# Patient Record
Sex: Female | Born: 1975 | Race: White | Hispanic: No | Marital: Married | Smoking: Former smoker
Health system: Southern US, Community
[De-identification: ages and names within clinical notes are randomized; demographics above are authoritative.]

## PROBLEM LIST (undated history)

## (undated) ENCOUNTER — Inpatient Hospital Stay (HOSPITAL_COMMUNITY): Payer: Self-pay

## (undated) DIAGNOSIS — B019 Varicella without complication: Secondary | ICD-10-CM

## (undated) DIAGNOSIS — F329 Major depressive disorder, single episode, unspecified: Secondary | ICD-10-CM

## (undated) DIAGNOSIS — R011 Cardiac murmur, unspecified: Secondary | ICD-10-CM

## (undated) DIAGNOSIS — F419 Anxiety disorder, unspecified: Secondary | ICD-10-CM

## (undated) DIAGNOSIS — R000 Tachycardia, unspecified: Secondary | ICD-10-CM

## (undated) DIAGNOSIS — O99619 Diseases of the digestive system complicating pregnancy, unspecified trimester: Secondary | ICD-10-CM

## (undated) DIAGNOSIS — R87619 Unspecified abnormal cytological findings in specimens from cervix uteri: Secondary | ICD-10-CM

## (undated) DIAGNOSIS — I10 Essential (primary) hypertension: Secondary | ICD-10-CM

## (undated) DIAGNOSIS — IMO0002 Reserved for concepts with insufficient information to code with codable children: Secondary | ICD-10-CM

## (undated) DIAGNOSIS — F32A Depression, unspecified: Secondary | ICD-10-CM

## (undated) DIAGNOSIS — E876 Hypokalemia: Secondary | ICD-10-CM

## (undated) DIAGNOSIS — J45909 Unspecified asthma, uncomplicated: Secondary | ICD-10-CM

## (undated) DIAGNOSIS — U071 COVID-19: Secondary | ICD-10-CM

## (undated) DIAGNOSIS — T7840XA Allergy, unspecified, initial encounter: Secondary | ICD-10-CM

## (undated) DIAGNOSIS — O1490 Unspecified pre-eclampsia, unspecified trimester: Secondary | ICD-10-CM

## (undated) DIAGNOSIS — D649 Anemia, unspecified: Secondary | ICD-10-CM

## (undated) DIAGNOSIS — K219 Gastro-esophageal reflux disease without esophagitis: Secondary | ICD-10-CM

## (undated) DIAGNOSIS — A749 Chlamydial infection, unspecified: Secondary | ICD-10-CM

## (undated) HISTORY — DX: Unspecified asthma, uncomplicated: J45.909

## (undated) HISTORY — PX: WISDOM TOOTH EXTRACTION: SHX21

## (undated) HISTORY — DX: Varicella without complication: B01.9

## (undated) HISTORY — DX: Unspecified pre-eclampsia, unspecified trimester: O14.90

## (undated) HISTORY — DX: Anemia, unspecified: D64.9

## (undated) HISTORY — DX: Tachycardia, unspecified: R00.0

## (undated) HISTORY — DX: COVID-19: U07.1

## (undated) HISTORY — DX: Cardiac murmur, unspecified: R01.1

## (undated) HISTORY — DX: Allergy, unspecified, initial encounter: T78.40XA

## (undated) HISTORY — DX: Hypokalemia: E87.6

---

## 1998-06-12 ENCOUNTER — Emergency Department (HOSPITAL_COMMUNITY): Admission: EM | Admit: 1998-06-12 | Discharge: 1998-06-12 | Payer: Self-pay | Admitting: Emergency Medicine

## 1998-12-16 ENCOUNTER — Other Ambulatory Visit: Admission: RE | Admit: 1998-12-16 | Discharge: 1998-12-16 | Payer: Self-pay | Admitting: *Deleted

## 1999-12-18 ENCOUNTER — Other Ambulatory Visit: Admission: RE | Admit: 1999-12-18 | Discharge: 1999-12-18 | Payer: Self-pay | Admitting: *Deleted

## 2001-03-27 ENCOUNTER — Other Ambulatory Visit: Admission: RE | Admit: 2001-03-27 | Discharge: 2001-03-27 | Payer: Self-pay | Admitting: *Deleted

## 2001-04-11 ENCOUNTER — Encounter: Payer: Self-pay | Admitting: Emergency Medicine

## 2001-04-11 ENCOUNTER — Emergency Department (HOSPITAL_COMMUNITY): Admission: EM | Admit: 2001-04-11 | Discharge: 2001-04-11 | Payer: Self-pay | Admitting: Emergency Medicine

## 2002-03-31 ENCOUNTER — Other Ambulatory Visit: Admission: RE | Admit: 2002-03-31 | Discharge: 2002-03-31 | Payer: Self-pay | Admitting: *Deleted

## 2003-05-20 ENCOUNTER — Other Ambulatory Visit: Admission: RE | Admit: 2003-05-20 | Discharge: 2003-05-20 | Payer: Self-pay | Admitting: *Deleted

## 2003-10-25 ENCOUNTER — Ambulatory Visit (HOSPITAL_COMMUNITY): Admission: RE | Admit: 2003-10-25 | Discharge: 2003-10-25 | Payer: Self-pay | Admitting: Obstetrics and Gynecology

## 2003-11-25 ENCOUNTER — Inpatient Hospital Stay (HOSPITAL_COMMUNITY): Admission: AD | Admit: 2003-11-25 | Discharge: 2003-11-25 | Payer: Self-pay | Admitting: Obstetrics and Gynecology

## 2003-12-15 ENCOUNTER — Inpatient Hospital Stay (HOSPITAL_COMMUNITY): Admission: AD | Admit: 2003-12-15 | Discharge: 2003-12-15 | Payer: Self-pay | Admitting: Obstetrics and Gynecology

## 2003-12-21 ENCOUNTER — Inpatient Hospital Stay (HOSPITAL_COMMUNITY): Admission: AD | Admit: 2003-12-21 | Discharge: 2003-12-26 | Payer: Self-pay | Admitting: Obstetrics and Gynecology

## 2003-12-23 ENCOUNTER — Encounter (INDEPENDENT_AMBULATORY_CARE_PROVIDER_SITE_OTHER): Payer: Self-pay | Admitting: Specialist

## 2003-12-27 ENCOUNTER — Encounter: Admission: RE | Admit: 2003-12-27 | Discharge: 2003-12-27 | Payer: Self-pay | Admitting: Obstetrics and Gynecology

## 2004-02-09 ENCOUNTER — Other Ambulatory Visit: Admission: RE | Admit: 2004-02-09 | Discharge: 2004-02-09 | Payer: Self-pay | Admitting: Obstetrics and Gynecology

## 2005-04-12 ENCOUNTER — Other Ambulatory Visit: Admission: RE | Admit: 2005-04-12 | Discharge: 2005-04-12 | Payer: Self-pay | Admitting: Obstetrics and Gynecology

## 2005-09-27 ENCOUNTER — Ambulatory Visit: Payer: Self-pay | Admitting: Family Medicine

## 2006-04-29 ENCOUNTER — Emergency Department (HOSPITAL_COMMUNITY): Admission: EM | Admit: 2006-04-29 | Discharge: 2006-04-29 | Payer: Self-pay | Admitting: Family Medicine

## 2006-12-20 ENCOUNTER — Emergency Department (HOSPITAL_COMMUNITY): Admission: EM | Admit: 2006-12-20 | Discharge: 2006-12-20 | Payer: Self-pay | Admitting: Family Medicine

## 2007-12-21 ENCOUNTER — Emergency Department (HOSPITAL_COMMUNITY): Admission: EM | Admit: 2007-12-21 | Discharge: 2007-12-21 | Payer: Self-pay | Admitting: Emergency Medicine

## 2008-04-23 ENCOUNTER — Ambulatory Visit (HOSPITAL_COMMUNITY): Admission: RE | Admit: 2008-04-23 | Discharge: 2008-04-23 | Payer: Self-pay | Admitting: Ophthalmology

## 2008-05-18 ENCOUNTER — Encounter: Admission: RE | Admit: 2008-05-18 | Discharge: 2008-08-16 | Payer: Self-pay | Admitting: Internal Medicine

## 2009-02-07 ENCOUNTER — Emergency Department (HOSPITAL_COMMUNITY): Admission: EM | Admit: 2009-02-07 | Discharge: 2009-02-07 | Payer: Self-pay | Admitting: Emergency Medicine

## 2010-06-02 NOTE — Op Note (Signed)
Kimberly [name], Kimberly [name]              ACCOUNT NO.:  [id number]   MEDICAL RECORD NO.:  [id number]          PATIENT TYPE:  INP   LOCATION:  9171                          FACILITY:  [facility]   PHYSICIAN:  Dineen Kid. Rana Snare, M.D.    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   PROCEDURE:  Primary low transverse cesarean section.   SURGEON:  Dineen Kid. Rana Snare, M.D.   ANESTHESIA:  Spinal.   INDICATIONS FOR PROCEDURE:  The patient is a 35 year old gravida 1, admitted  to the hospital for preeclampsia.  She had an amniocentesis yesterday which  returned as mature.  She was undergoing induction of labor, had spontaneous  rupture of membranes, had port wine amniotic fluid, and mild to moderate  bleeding in the fluid.  Cervix was 2 cm, 50%, -3 station.  Fetal heart rate  was still reactive, but because of probable abruptio placenta remote from  delivery, planned primary low transverse cesarean section.  Risks and  benefits were discussed, and informed consent was obtained.   FINDINGS:  1.  A 25% abruptio placenta in the lower edge of the placenta.  2.  Viable female infant, Apgars 9 and 9, pH arterial 7.34, and weight 6      pounds 5 ounces.   DESCRIPTION OF PROCEDURE:  After adequate analgesia, the patient was placed  in the supine position with a left lateral tilt.  She was sterilely prepped  and draped.  The bladder was sterilely drained with Foley catheter.  Pfannenstiel skin incision was made two fingerbreadths above the pubic  symphysis, taken down sharply to the fascia which was incised transversely  and extended superiorly and inferiorly off the bellies of the rectus muscle,  separated sharply in the midline, the peritoneum was entered sharply.  The  bladder flap was created and replaced behind the bladder blade.  Low segment  myotomy incision was made down to the infant's vertex, extended laterally  with the operator's fingertips.  The lower edge of the placenta was noted to  be separated at the presentation with the fetal vertex.  The infant's vertex  was delivered.  The nares and pharynx suctioned.  The remainder of the  infant was then delivered.  Cord clamped and cut and handed to the  pediatricians for resuscitation.  Cord blood was then obtained.  Placenta  extracted manually.  The uterus was exteriorized, wiped clean with a dry  lap. The myotomy incision was closed in two layers, first being a running  locking layer, the second being an imbricating layer of 0 Monocryl suture.  The uterus was placed back into the peritoneal cavity and after copious  amount of irrigation, adequate hemostasis was assured, the peritoneum was  closed with 0 Monocryl.  Rectus muscle plicated in the midline.  The fascia  was then  closed with a #1 PDS in a running fashion.  Irrigation was applied  and after adequate hemostasis, skin staples and Steri-Strips applied.  The  patient tolerated the procedure well and was stable on transfer to the  recovery room.  The needle, sponge, and instrument counts correct x3.  Estimated blood loss  was 800 mL.  The patient received 1 gram of Rocephin after delivery of the  placenta.  The patient will be transferred to the intensive care unit for  continuation of magnesium sulfate for 24 hours due to preeclampsia.      DCL/MEDQ  D:  12/23/2003  T:  12/23/2003  Job:  [id number]

## 2010-06-02 NOTE — Assessment & Plan Note (Signed)
[facility]                             [facility] OFFICE NOTE   NAME:Kimberly [name], Kimberly [name]                       MRN:          [id number]  DATE:09/28/2005                            DOB:          [birth year]    NEW PATIENT HISTORY AND PHYSICAL   CHIEF COMPLAINT:  This 35 year old white female here to establish new  doctor.   HISTORY OF PRESENT ILLNESS:  Ms. Santucci states that she currently works at  [employer].  She was referred to see a doctor because of the  fact that she had lab testing done in a [facility].  She was led to  discuss the following:  1. Hypercholesterolemia:  When she had labs done with the [facility]ness Program      in May of 2007, it showed evidence of an LDL of 147, HDL 54, and      triglycerides 271.  She is open to learning about diet, exercise, as      well as medication.  She already feels that she eats a fairly healthy      diet but recognizes that they do eat out about three times a week      because they are very busy.  2. Anxiety, chronic:  She has been on Zoloft and a generic Sertraline for      about three years.  She was given this prescription by her      gynecologist, Dr. Rana Snare.  The Zoloft initially was helping significantly      with her anxiety, but then when she had to switch over to generic      Sertraline she noted that it felt as though it was not helping as much      as the Zoloft.  She states that she does not have any sadness,      anhedonia, or insomnia, excessive guilt or slowed thinking.  She does      state she has some decreased motivation, intermittent headaches, and is      generally anxious throughout the day.   REVIEW OF SYSTEMS:  Occasional headache, no dizziness, no syncope, no  shortness of breath, no chest pain, no nausea, vomiting, diarrhea,  constipation, some symptoms of allergic rhinitis with runny nose,  congestion.   PAST MEDICAL HISTORY:  1. Anxiety.  2.  Hypercholesterolemia.  3. Preeclampsia history.   HOSPITALIZATIONS/SURGERIES/PROCEDURES:  1. 2005, C-section for placental abruption.  2. Pap smear in March 2007, negative.  3. Tetanus in 2006.   ALLERGIES:  None.   MEDICATIONS:  Trivora birth control pills.   FAMILY HISTORY:  Father deceased at age 26 from suicide.  He has a history  of alcohol and depression.  Mother alive at age 51, who is healthy.  There  is no family history of a heart attack before age 59.  She has two brothers,  who are healthy.  She has a paternal grandmother, who had breast cancer.  She does not think there is any family history of bipolar disorder.  She did  have an aunt with a brain tumor  and who had a stroke.  There is no family  history of diabetes.   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  She has  been married for eight years, and denies any domestic abuse.  She has a  child, who is 77-1/2 years old.  She does not get any regular exercise but  recognizes that she needs to.  She has a varied diet but eats out three  times per week.  She does drink a lot of soda, and a limited amount of  water.  She does not smoke, no alcohol use, and no illegal drug use.   PHYSICAL EXAMINATION:  VITAL SIGNS:  Height 64 inches, weight 222, making  BMI approximately 38.  Blood pressure 114/80, pulse 80, temperature 98.3.  GENERAL:  An overweight-appearing female in no apparent distress.  HEENT:  PERRLA, extraocular muscles intact, oropharynx clear, tympanic  membranes clear, some pale turbinates bilaterally.  NECK:  No thyromegaly, no lymphadenopathy, cervical or supraclavicular.  PULMONARY:  Clear to auscultation bilaterally.  No wheezes, rales, or  rhonchi.  CARDIOVASCULAR:  Regular rate and rhythm, no murmurs, rubs, or gallops, 2+  peripheral pulses, no peripheral edema.  MUSCULOSKELETAL:  Strength 5 out of 5 in upper and lower extremities.  ABDOMEN:  Soft, nontender, normoactive bowel  sounds, no hepatosplenomegaly.  NEUROLOGIC:  Alert and oriented x3, cranial nerves II-XII grossly intact.  PSYCH:  Appropriate affect, good insight.   ASSESSMENT AND PLAN:  1. Hypertriglyceridemia:  After reviewing her cholesterol panel and her      risk factors, it appears that her goal LDL cholesterol is less than      160.  She is at her goal for both HDL and LDL.  She is above goal for      her triglycerides at 271.  We discussed healthy eating habits and how      to decrease triglycerides.  She was given lots of different information      and dietary techniques to lose weight and have a heart-healthy diet.      In addition to that, she would like to start a medication to decrease      her triglycerides.  We will begin Fenofibrate at 160 mg daily.  We will      recheck her cholesterol in approximately three months.  2. Anxiety, chronic:  We will begin another generic medication to see if      this improves her anxiety.  She will start Fluoxetine 40 mg daily.  She      will follow up on her mood in approximately three months.  3. Allergic rhinitis:  She will use over-the-counter Claritin.  She was      also given a Rhinocort sample today to try as a nasal spray to decrease      her symptoms.  She will let me know if she develops more sinus pressure      and fever suggestive of acute sinusitis.  4. Prevention:  She did not have a basic metabolic panel or diabetes      screen done at the      same time as her cholesterol was checked.  This is something we can      discuss in the future.      It appears she is up to date with Pap smears and her tetanus.  She has      no indication for early colon cancer screening or breast cancer  screening.                                    Kerby Nora, MD   AB/MedQ  DD:  09/28/2005  DT:  09/29/2005  Job #:  [id number]

## 2010-06-02 NOTE — H&P (Signed)
Kimberly [name], STINGLEY              ACCOUNT NO.:  [id number]   MEDICAL RECORD NO.:  [id number]          PATIENT TYPE:  INP   LOCATION:  9171                          FACILITY:  [facility]   PHYSICIAN:  Guy Sandifer. Tomblin II, M.D.DATE OF BIRTH:  1975-12-21   DATE OF ADMISSION:  12/21/2003  DATE OF DISCHARGE:                                HISTORY & PHYSICAL   CHIEF COMPLAINT:  Preeclampsia.   HISTORY OF PRESENT ILLNESS:  This patient is a 35 year old married white  female G2 P0 with an EDC of January 19, 2004 consistent with a 10 and one-  half week ultrasound placing her at 47 and five-sevenths weeks whose  prenatal care has been complicated by chronic hypertension.  She has been on  labetalol 100 mg b.i.d.  She was evaluated in the office on December 20, 2003  and was noted to have a blood pressure of 176/90.  Protein was trace.  Protein had been 1+ the week before.  Her labetalol was increased to 200 mg  b.i.d.  PIH labs obtained on December 20, 2003 were within normal limits.  The patient was taken out of work, modified bedrest was discussed with the  patient, and her NST was reactive that day.  She returns today complaining  of a headache awakening her early this morning that has continued through  the day.  No vision changes, no epigastric pain.  She is being admitted for  evaluation and treatment of preeclampsia.   PAST MEDICAL HISTORY, PAST SURGICAL HISTORY, FAMILY HISTORY, OBSTETRIC  HISTORY:  See prenatal history and physical.   MEDICATIONS:  1.  Labetalol 200 mg b.i.d.  2.  Prenatal vitamins daily.   ALLERGIES:  No known drug allergies.   REVIEW OF SYSTEMS:  NEURO:  Headache as above.  GI:  Denies epigastric pain.  PULMONARY:  Denies shortness of breath.  CARDIO:  Denies chest pain.   PHYSICAL EXAMINATION:  VITAL SIGNS:  Height 5 feet 3 inches, weight 251.5  pounds, blood pressure 180/104.  HEENT:  With periorbital edema.  LUNGS:  Clear to auscultation.  HEART:  Regular rate  and rhythm.  BACK:  Without CVA tenderness.  BREASTS:  Not examined.  ABDOMEN:  Gravid with no epigastric tenderness.  PELVIC:  Cervix was closed, thick, and high on December 20, 2003.  EXTREMITIES:  Show 2-3+ edema.  Deep tendon reflexes 3+ without clonus  bilaterally in the lower extremities.   ASSESSMENT:  Chronic hypertension, probable preeclampsia.   PLAN:  The patient will be admitted to labor and delivery.  Will obtain  ultrasound and PIH laboratories and external fetal monitors.  Magnesium  sulfate for seizure prophylaxis discussed with the patient, probable  delivery.  Will evaluate after above data.      JET/MEDQ  D:  12/21/2003  T:  12/21/2003  Job:  [id number]

## 2010-06-02 NOTE — Discharge Summary (Signed)
Kimberly [name], Kimberly [name]              ACCOUNT NO.:  [id number]   MEDICAL RECORD NO.:  [id number]          PATIENT TYPE:  INP   LOCATION:  9374                          [facility]ACILITY:  [facility]   PHYSICIAN:  Michelle L. Grewal, M.D.DATE OF BIRTH:  [birth year]   DATE OF ADMISSION:  12/21/2003  DATE OF DISCHARGE:  12/26/2003                                 DISCHARGE SUMMARY   ADMISSION DIAGNOSES:  1.  Intrauterine pregnancy at 36-1/7 weeks estimated gestational age.  2.  Preeclampsia.   DISCHARGE DIAGNOSES:  1.  Status post low transverse cesarean section secondary to probable      abruptio placentae.  2.  Viable female infant.   PROCEDURE:  Primary low transverse cesarean section.   REASON FOR ADMISSION:  Please see dictated H&P.   HOSPITAL COURSE:  The patient is a 35 year old primigravida who was admitted  to [facility] for an induction of labor due to  preeclampsia.  The patient had had an amniocentesis on the day prior to  admission which had returned as mature.  The patient was undergoing  induction of labor at which time she had a spontaneous rupture of membranes.  Fluid was noted to be port-wine stained, with a mild-to-moderate amount of  bleeding noted.  The cervix was now 2 cm dilated, 50% effaced, with the  vertex at a -3 station.  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  The patient was then transferred to the operating room where  spinal anesthesia was administered without difficulty.  A low transverse  incision was made, with delivery of a viable female infant weighing 6 pounds  5 ounces, with Apgars of 9 at one minute and 9 at five minutes.  Arterial pH  was 7.34.  The patient tolerated the procedure well and was taken to the  recovery room in stable condition and later transferred to the AICU where  she was administered magnesium sulfate.   On postoperative day  #1, vital signs were stable.  Blood pressure was 140/88  after labetalol push which had been previously noted at 172/105.  Urine  output was adequate, with 300 cc over the last two hours.  The abdomen was  soft.  The abdominal dressing was clean, dry, and intact.  Labs revealed a  hemoglobin of 12.1, platelet count of 176,000, WBC count of 8.5.  AST was  21, and ALT was 13.  The patient was changed to labetalol 300 mg t.i.d., and  she continued on magnesium sulfate.   On postoperative day #2, the patient was feeling better.  Vital signs were  stable, but blood pressure continued to fluctuate between the 140s to  170s/80s to the low 100s.  Urine output was good, with good diuresis.  Lungs  were clear to auscultation.  The abdomen was soft.  The incision was clean,  dry, and intact.  PIH labs were within normal limits.  Magnesium sulfate was  discontinued, and the patient continued on labetalol 300 mg three times a  day.  She was then transferred to the mother/baby unit.   On postoperative day #3, the patient was feeling better.  Vital signs were  stable.  Blood pressure was improved.  The fundus was firm and nontender.  The incision was clean, dry, and intact.  Her staples were removed, and the  patient was discharged home.   CONDITION ON DISCHARGE:  Stable.   DIET:  Regular as tolerated.   ACTIVITY:  No heavy lifting, no driving x2 weeks, no vaginal entry.   FOLLOW UP:  The patient is to follow up in the office in one week for an  incision check, blood pressure check, and laboratory findings.  She is to  call for a temperature greater than 100 degrees, persistent nausea or  vomiting, heavy vaginal bleeding, and/or redness or drainage from the  incisional site.  The patient was also encouraged to call for headache,  blurred vision, right upper quadrant pain.   DISCHARGE MEDICATIONS:  1.  Tylox, #30, one p.o. every four to six hours p.r.n.  2.  Labetalol 300 mg t.i.d.  3.   Procardia XL 60 mg daily.  4.  Prenatal vitamins one p.o. daily.  5.  Colace one p.o. daily p.r.n.     Caro   CC/MEDQ  D:  01/21/2004  T:  01/21/2004  Job:  [id number]

## 2011-03-30 LAB — OB RESULTS CONSOLE HEPATITIS B SURFACE ANTIGEN: Hepatitis B Surface Ag: NEGATIVE

## 2011-03-30 LAB — OB RESULTS CONSOLE RUBELLA ANTIBODY, IGM: Rubella: IMMUNE

## 2011-03-30 LAB — OB RESULTS CONSOLE RPR: RPR: NONREACTIVE

## 2011-03-30 LAB — OB RESULTS CONSOLE ABO/RH: RH Type: NEGATIVE

## 2011-03-30 LAB — OB RESULTS CONSOLE ANTIBODY SCREEN: Antibody Screen: NEGATIVE

## 2011-08-01 ENCOUNTER — Inpatient Hospital Stay (HOSPITAL_COMMUNITY): Payer: 59

## 2011-08-01 ENCOUNTER — Encounter (HOSPITAL_COMMUNITY): Payer: Self-pay

## 2011-08-01 ENCOUNTER — Inpatient Hospital Stay (HOSPITAL_COMMUNITY)
Admission: AD | Admit: 2011-08-01 | Discharge: 2011-08-02 | DRG: 781 | Disposition: A | Discharge status: Home / Self Care | Payer: 59 | Source: Ambulatory Visit | Attending: Obstetrics and Gynecology | Admitting: Obstetrics and Gynecology

## 2011-08-01 DIAGNOSIS — O99891 Other specified diseases and conditions complicating pregnancy: Principal | ICD-10-CM | POA: Diagnosis present

## 2011-08-01 DIAGNOSIS — E876 Hypokalemia: Secondary | ICD-10-CM | POA: Diagnosis present

## 2011-08-01 DIAGNOSIS — I498 Other specified cardiac arrhythmias: Secondary | ICD-10-CM | POA: Diagnosis present

## 2011-08-01 HISTORY — DX: Essential (primary) hypertension: I10

## 2011-08-01 HISTORY — DX: Anxiety disorder, unspecified: F41.9

## 2011-08-01 HISTORY — DX: Major depressive disorder, single episode, unspecified: F32.9

## 2011-08-01 HISTORY — DX: Depression, unspecified: F32.A

## 2011-08-01 LAB — CBC
MCH: 26.7 pg (ref 26.0–34.0)
MCHC: 32.8 g/dL (ref 30.0–36.0)
RDW: 14.6 % (ref 11.5–15.5)

## 2011-08-01 LAB — COMPREHENSIVE METABOLIC PANEL
Alkaline Phosphatase: 55 U/L (ref 39–117)
BUN: 6 mg/dL (ref 6–23)
CO2: 21 mEq/L (ref 19–32)
Chloride: 105 mEq/L (ref 96–112)
Creatinine, Ser: 0.53 mg/dL (ref 0.50–1.10)
GFR calc non Af Amer: >90 mL/min (ref >90)
Glucose, Bld: 87 mg/dL (ref 70–99)
Potassium: 2.7 mEq/L — CL (ref 3.5–5.1)
Total Bilirubin: 0.2 mg/dL — ABNORMAL LOW (ref 0.3–1.2)

## 2011-08-01 LAB — LACTATE DEHYDROGENASE: LDH: 155 U/L (ref 94–250)

## 2011-08-01 LAB — URIC ACID: Uric Acid, Serum: 4.6 mg/dL (ref 2.4–7.0)

## 2011-08-01 MED ORDER — SODIUM CHLORIDE 0.9 % IJ SOLN
3.0000 mL | INTRAMUSCULAR | Status: DC | PRN
  Administered 2011-08-02: 3 mL via INTRAVENOUS

## 2011-08-01 MED ORDER — ZOLPIDEM TARTRATE 5 MG PO TABS: 10.0000 mg | ORAL_TABLET | Freq: Every evening | ORAL | Status: DC | PRN

## 2011-08-01 MED ORDER — SODIUM CHLORIDE 0.9 % IJ SOLN: 3.0000 mL | Freq: Two times a day (BID) | INTRAMUSCULAR | Status: DC

## 2011-08-01 MED ORDER — SODIUM CHLORIDE 0.9 % IV SOLN
250.0000 mL | INTRAVENOUS | Status: DC | PRN
  Administered 2011-08-01: 1000 mL via INTRAVENOUS

## 2011-08-01 MED ORDER — SODIUM CHLORIDE 0.9 % IV SOLN: 250.0000 mL | INTRAVENOUS | Status: DC | PRN

## 2011-08-01 MED ORDER — POTASSIUM CHLORIDE CRYS ER 20 MEQ PO TBCR
40.0000 meq | EXTENDED_RELEASE_TABLET | ORAL | Status: AC
  Administered 2011-08-01 – 2011-08-02 (×2): 40 meq via ORAL
  Filled 2011-08-01 (×2): qty 2

## 2011-08-01 MED ORDER — ZOLPIDEM TARTRATE 5 MG PO TABS
5.0000 mg | ORAL_TABLET | Freq: Every evening | ORAL | Status: DC | PRN
  Administered 2011-08-01: 5 mg via ORAL
  Filled 2011-08-01: qty 1

## 2011-08-01 NOTE — Progress Notes (Signed)
To AICU via w/c. Spouse returning soon to join pt in AICU

## 2011-08-01 NOTE — MAU Note (Signed)
Patient states she was sent from the office for evaluation. Patient states she has been waking up between 0300-0400 with shortness of breath that has been happening for about one week. Having some lower abdominal pressure. Reports good fetal movement, no bleeding or leaking.

## 2011-08-01 NOTE — MAU Note (Signed)
Dr Henderson Cloud called MAU with plan of care. Will admit pt to AICU overnight. MD preparing to go to OR but will place admit order so pt can be moved upstairs. House coverage notified.

## 2011-08-01 NOTE — MAU Note (Signed)
Sent from Dr. Isidore Moos for SOB at night during sleeping.  SOB wakens pt at night.  Pt to MAU for evaluation.

## 2011-08-01 NOTE — MAU Note (Signed)
CRITICAL VALUE ALERT  Critical value received:  2.7 Potassium  Date of notification:  08/01/11  Time of notification:  1942  Critical value read back: yes  Nurse who received alert: Peace Gordan Payment RN  MD notified (1st page):  Lilyan Punt NP  Time of first page:  1943  MD notified (2nd page)Dr Henderson Cloud notified by Lilyan Punt NP  Time of second page:1945  Responding MD:  Dr Henderson Cloud  Time MD responded:  1945

## 2011-08-01 NOTE — Progress Notes (Signed)
Lilyan Punt NP in and EFM reviewed. OK to d/c EFM per NP.

## 2011-08-01 NOTE — H&P (Signed)
Kimberly [name] is a 36 y.o. female presenting for C/O SOB and rapid heart rate for about 1 month.  Occurs at night until about 11 am next day then she feels fine.  No cough or chest pain. No leg pain or cramps. History OB History    Grav Para Term Preterm Abortions TAB SAB Ect Mult Living   2 1  1      1      Past Medical History  Diagnosis Date  . Anxiety   . Depression   . Hypertension    Past Surgical History  Procedure Date  . Cesarean section    Family History: family history is negative for Other. Social History:  reports that she has never smoked. She has never used smokeless tobacco. She reports that she does not drink alcohol or use illicit drugs.   Prenatal Transfer Tool  Maternal Diabetes: No Genetic Screening: Normal Maternal Ultrasounds/Referrals: Normal Fetal Ultrasounds or other Referrals:  None Maternal Substance Abuse:  No Significant Maternal Medications:  None Significant Maternal Lab Results:  None Other Comments:  None  Review of Systems  Respiratory: Positive for shortness of breath.   Cardiovascular: Positive for palpitations.      Blood pressure 125/70, pulse 95, temperature 98.6 F (37 C), temperature source Oral, resp. rate 20, height 5\' 4"  (1.626 m), weight 118.57 kg (261 lb 6.4 oz), SpO2 98.00%.   Fetal Exam Fetal Monitor Review: Pattern: accelerations present.       Physical Exam  Cardiovascular:       Sinus tachycardia 110s-120s Occasional PVC  Respiratory: Effort normal and breath sounds normal.  GI: There is no tenderness.  Neurological: She has normal reflexes.    Prenatal labs: ABO, Rh:   Antibody:   Rubella:   RPR:    HBsAg:    HIV:    GBS:     Assessment/Plan: 36 yo G2P1 at 70 6/7 weeks with sinus tachycardia. D/W intensivist-she will replace potassium and monitor cardiac status. Possible cardiology consult in am.   Aloysious Vangieson II,Koree Schopf E 08/01/2011, 10:48 PM

## 2011-08-01 NOTE — MAU Provider Note (Signed)
  History     CSN: [id number]  Arrival date and time: 08/01/11 1725   First Provider Initiated Contact with Patient 08/01/11 1809      No chief complaint on file.  HPI Kimberly [name] 36 y.o. [redacted]w[redacted]d Was seen in the office today.  Has been having episodes of awakening at night with shortness of breath and increased heart rate.  Then is unable to go back to sleep.  Has been happening nightly.  Hx of anxiety and stopped anxiety medication with this pregnancy.  OB History    Grav Para Term Preterm Abortions TAB SAB Ect Mult Living   2 1  1      1       Past Medical History  Diagnosis Date  . Anxiety   . Depression   . Hypertension     Past Surgical History  Procedure Date  . Cesarean section     Family History  Problem Relation Age of Onset  . Other Neg Hx     History  Substance Use Topics  . Smoking status: Never Smoker   . Smokeless tobacco: Never Used  . Alcohol Use: No    Allergies: No Known Allergies  Prescriptions prior to admission  Medication Sig Dispense Refill  . acetaminophen (TYLENOL) 325 MG tablet Take 650 mg by mouth every 6 (six) hours as needed. Head ache      . cetirizine (ZYRTEC) 10 MG tablet Take 10 mg by mouth daily. allergies        Review of Systems  Constitutional: Negative for fever.  Respiratory: Positive for shortness of breath. Negative for wheezing.   Cardiovascular: Negative for chest pain.       Increased heart rate  Gastrointestinal: Negative for nausea, vomiting, abdominal pain and diarrhea.  Genitourinary:       No vaginal discharge. No vaginal bleeding. No dysuria.   Physical Exam   Blood pressure 136/82, pulse 110, temperature 98.9 F (37.2 C), temperature source Oral, resp. rate 18, height 5\' 4"  (1.626 m), weight 261 lb 6.4 oz (118.57 kg), SpO2 98.00%.  Physical Exam  Nursing note and vitals reviewed. Constitutional: She is oriented to person, place, and time. She appears well-developed and well-nourished. No  distress.  HENT:  Head: Normocephalic.  Eyes: EOM are normal.  Neck: Neck supple.  Cardiovascular: Normal rate and regular rhythm.   No murmur heard. Respiratory: Effort normal and breath sounds normal.  Musculoskeletal: Normal range of motion.  Neurological: She is alert and oriented to person, place, and time.  Skin: Skin is warm and dry.  Psychiatric: She has a normal mood and affect.  O2 sat 96-98%  MAU Course  Procedures  MDM Consult with Dr. Henderson Cloud re: plan of care.  EKG done - Report on Chart, Abnormal EKG by preliminary report  Clinical Data: Shortness of breath. Pregnant  CHEST - 2 VIEW  Comparison: None.  Findings: The patient was double shielded.  Normal heart size with clear lung fields. No bony abnormality.  IMPRESSION:  Negative exam.  1944 Consult with Dr. Henderson Cloud.  Testing results to date reviewed.  Call again when all results are back.  Potassium 2.7.  Will plan for PO replacement.  Assessment and Plan  Care assumed by Jeani Sow, NP at [facility] 08/01/2011, 6:49 PM

## 2011-08-01 NOTE — MAU Note (Signed)
Reports some "indigestion" which has had off and on the entire preg. Usually takes 2 Tums and goes away.

## 2011-08-01 NOTE — MAU Note (Signed)
EKG results reviewed by Lilyan Punt NP

## 2011-08-02 LAB — BASIC METABOLIC PANEL
Chloride: 106 mEq/L (ref 96–112)
Creatinine, Ser: 0.53 mg/dL (ref 0.50–1.10)
GFR calc Af Amer: >90 mL/min (ref >90)
Sodium: 137 mEq/L (ref 135–145)

## 2011-08-02 LAB — MRSA PCR SCREENING: MRSA by PCR: NEGATIVE

## 2011-08-02 MED ORDER — POTASSIUM CHLORIDE CRYS ER 20 MEQ PO TBCR
40.0000 meq | EXTENDED_RELEASE_TABLET | Freq: Once | ORAL | Status: AC
  Administered 2011-08-02: 40 meq via ORAL
  Filled 2011-08-02: qty 2

## 2011-08-02 MED ORDER — POTASSIUM CHLORIDE ER 10 MEQ PO TBCR: 20.0000 meq | EXTENDED_RELEASE_TABLET | Freq: Two times a day (BID) | ORAL | Status: DC

## 2011-08-02 NOTE — Progress Notes (Signed)
Patient given discharge instructions and questions answered. Removed from telemetry and IV removed. Patient ambulated out with NT and husband without any difficulty.

## 2011-08-02 NOTE — Progress Notes (Signed)
New admit to room 371 under services Dr. Henderson Cloud.  Rec'd via WC from MAU for telemetry monitoring.  G2P1 at 25.[redacted]wks gestation.  Admitted with c/o SOB, tachycardia - inc during the night - x approx 1 month.  Tele leads applied.  HOB elevated for comfort, SR up x 2.  IV NS inf at 77ml/hr on admission.  Pt states good FM (was monitored in MAU), denies u/c - abd palp soft and nontender, no vaginal bleeding.  Hx PIH - denies H/A, VD or epigastric pain.  DTR's +2, no clonus, +2 edema noted BLE.   Waiting for Dr. Henderson Cloud to come to floor and CCM consult.   Pt denies discomfort or SOB on admission.

## 2011-08-02 NOTE — Progress Notes (Signed)
UR Chart review completed.  

## 2011-08-02 NOTE — Discharge Summary (Signed)
Physician Discharge Summary  Patient ID: Kimberly [name] MRN: [id number] DOB/AGE: Aug 17, 1975 35 y.o.  Admit date: 08/01/2011 Discharge date: 08/02/2011  Admission Diagnoses:  Tachycardia, pregnancy  Discharge Diagnoses: same Active Problems:  * No active hospital problems. *    Discharged Condition: stable  Hospital Course: Pt admitted for observation.  Labs revealed hypokalemia.  EKG with sinus tachy.  Pt was admitted for IV fluids and potassium replacement.  Symptoms improved.  Plan for outpatient cardiology consult  Consults: None  Significant Diagnostic Studies: labs: cbc, cmet and cardiac graphics: ECG:  Treatments: IV hydration and po potassium  Discharge Exam: Blood pressure 131/74, pulse 113, temperature 98.1 F (36.7 C), temperature source Oral, resp. rate 25, height 5\' 4"  (1.626 m), weight 118.616 kg (261 lb 8 oz), SpO2 98.00%. General appearance: alert and cooperative Resp: clear to auscultation bilaterally Cardio: sinus tachy Extremities: extremities normal, atraumatic, no cyanosis or edema  Disposition:    Medication List  As of 08/02/2011  9:04 AM   TAKE these medications         acetaminophen 325 MG tablet   Commonly known as: TYLENOL   Take 650 mg by mouth every 6 (six) hours as needed. Head ache      cetirizine 10 MG tablet   Commonly known as: ZYRTEC   Take 10 mg by mouth daily. allergies      potassium chloride 10 MEQ tablet   Commonly known as: K-DUR   Take 2 tablets (20 mEq total) by mouth 2 (two) times daily.           Follow-up Information    Follow up in 1 week. (as scheduled)          Signed: Tyrell Brereton 08/02/2011, 9:04 AM

## 2011-08-02 NOTE — Progress Notes (Signed)
Pt reports her symptoms improved overnight.  Denies SOB, CP, lightheadedness.  No ctx, vb or lof.  Good FM   BP 131/74 HR 102-112 RR 22 O2 sat 96-98% Gen - NAD CV - sinus tachy Lungs - clear bilaterally Ext - trace edema bilaterally Abd - gravid, NT  Labs reviewed:  K+3.0  A/P:  26 wks w/ sinus tach.  Symptoms improved. Plan for discharge.  Will arrange outpt cardiology consult Continue PO potassium

## 2011-08-03 ENCOUNTER — Encounter: Payer: Self-pay | Admitting: Cardiology

## 2011-08-03 ENCOUNTER — Ambulatory Visit (INDEPENDENT_AMBULATORY_CARE_PROVIDER_SITE_OTHER): Payer: 59 | Admitting: Cardiology

## 2011-08-03 ENCOUNTER — Ambulatory Visit (HOSPITAL_COMMUNITY): Payer: 59 | Attending: Cardiovascular Disease | Admitting: Radiology

## 2011-08-03 VITALS — BP 126/86 | HR 116 | Ht 64.0 in | Wt 261.0 lb

## 2011-08-03 DIAGNOSIS — R000 Tachycardia, unspecified: Secondary | ICD-10-CM | POA: Insufficient documentation

## 2011-08-03 DIAGNOSIS — E876 Hypokalemia: Secondary | ICD-10-CM

## 2011-08-03 DIAGNOSIS — I2510 Atherosclerotic heart disease of native coronary artery without angina pectoris: Secondary | ICD-10-CM | POA: Insufficient documentation

## 2011-08-03 MED ORDER — POTASSIUM CHLORIDE ER 10 MEQ PO TBCR: 10.0000 meq | EXTENDED_RELEASE_TABLET | Freq: Every day | ORAL | Status: DC

## 2011-08-03 NOTE — Progress Notes (Signed)
   HPI  The patient is a very healthy young woman who is pregnant with her second child. She relates a history of probable preeclampsia with her first child. She is otherwise healthy. During this pregnancy she's noted some increased heart rate. She felt poorly and was admitted briefly to [facility]. She received fluids and some potassium and she felt better. She says that she can feel the increased heart rate at times. She had sinus tachycardia. She says that her TSH was normal. I cannot find record of this in the hospital record. She's not had any chest pain. There's been no syncope or presyncope.  No Known Allergies  Current Outpatient Prescriptions  Medication Sig Dispense Refill  . cetirizine (ZYRTEC) 10 MG tablet Take 10 mg by mouth daily. allergies      . potassium chloride (K-DUR) 10 MEQ tablet Take 2 tablets (20 mEq total) by mouth 2 (two) times daily.  6 tablet  0  . Prenatal Multivit-Min-Fe-FA (PRE-NATAL PO) Take by mouth daily.      Marland Kitchen acetaminophen (TYLENOL) 325 MG tablet Take 650 mg by mouth every 6 (six) hours as needed. Head ache        History   Social History  . Marital Status: Married    Spouse Name: N/A    Number of Children: N/A  . Years of Education: N/A   Occupational History  . Not on file.   Social History Main Topics  . Smoking status: Never Smoker   . Smokeless tobacco: Never Used  . Alcohol Use: No  . Drug Use: No  . Sexually Active: Yes    Birth Control/ Protection: None   Other Topics Concern  . Not on file   Social History Narrative  . No narrative on file    Family History  Problem Relation Age of Onset  . Other Neg Hx     Past Medical History  Diagnosis Date  . Anxiety   . Depression   . Hypertension   . Tachycardia     Past Surgical History  Procedure Date  . Cesarean section     ROS   Patient denies fever, chills, headache, sweats, rash, change in vision, change in hearing, cough, nausea vomiting, urinary symptoms. All  other systems are reviewed and are negative.  PHYSICAL EXAM   Patient is oriented to person time and place. Affect is normal. She's here with her husband. She is overweight. She appears to be quite stable. There is no jugular venous distention. Lungs are clear. Respiratory effort is nonlabored. Cardiac exam reveals S1 and S2. There are no clicks or significant murmurs. She has mild resting sinus tachycardia. The abdomen is soft. There is no significant edema.     Filed Vitals:   08/03/11 1520  BP: 126/86  Pulse: 116  Height: 5\' 4"  (1.626 m)  Weight: 261 lb (118.389 kg)   EKG is done today and reviewed by me. The QRS is normal. She has sinus rhythm with sinus tachycardia at a rate of 116.  ASSESSMENT & PLAN

## 2011-08-03 NOTE — Assessment & Plan Note (Signed)
The patient's potassium was unexpectedly low at 2.7 on August 02, 2011. She received some potassium and was sent home on a few days of potassium. It is not clear to me why her potassium would be low in the first place. I have added 10 mEq of potassium daily to her regimen. She will continue this until her labs are rechecked by her primary team. It may be worthwhile following her potassium over time to see if she has a chronic problem with low potassium.

## 2011-08-03 NOTE — Patient Instructions (Addendum)
Your physician recommends that you schedule a follow-up appointment in: No further follow up necessary at this time  Your physician has recommended you make the following change in your medication: Start Potassium daily

## 2011-08-03 NOTE — Assessment & Plan Note (Addendum)
The patient appears healthy. EKG reveals sinus tachycardia. Two-dimensional echo was done today and reviewed by me. The ejection fraction is 60%. There are no wall motion abnormalities. Diastolic function is normal. Valve function is normal. At this time I find no obvious cardiac basis for her tachycardia. It is of note that her heart rate is reported to have been normal while she was sleeping. I think that her tachycardia is related to her overall state when she is awake. She does have some anxiety. I have tried to reassure her. Low dose of a beta blocker could be used if it is approved by her GYN doctors, but I am not recommending it at this time. She needs no further cardiac workup.

## 2011-08-03 NOTE — Progress Notes (Signed)
Echocardiogram performed.  

## 2011-08-06 ENCOUNTER — Encounter: Payer: Self-pay | Admitting: Cardiology

## 2011-10-08 ENCOUNTER — Encounter (HOSPITAL_COMMUNITY): Payer: Self-pay | Admitting: *Deleted

## 2011-10-08 ENCOUNTER — Inpatient Hospital Stay (HOSPITAL_COMMUNITY): Payer: 59

## 2011-10-08 ENCOUNTER — Inpatient Hospital Stay (HOSPITAL_COMMUNITY)
Admission: AD | Admit: 2011-10-08 | Discharge: 2011-10-08 | Disposition: A | Discharge status: Home / Self Care | Payer: 59 | Source: Ambulatory Visit | Attending: Obstetrics and Gynecology | Admitting: Obstetrics and Gynecology

## 2011-10-08 DIAGNOSIS — O139 Gestational [pregnancy-induced] hypertension without significant proteinuria, unspecified trimester: Secondary | ICD-10-CM | POA: Insufficient documentation

## 2011-10-08 HISTORY — DX: Chlamydial infection, unspecified: A74.9

## 2011-10-08 HISTORY — DX: Unspecified abnormal cytological findings in specimens from cervix uteri: R87.619

## 2011-10-08 HISTORY — DX: Reserved for concepts with insufficient information to code with codable children: IMO0002

## 2011-10-08 LAB — COMPREHENSIVE METABOLIC PANEL
AST: 18 U/L (ref 0–37)
Albumin: 2.5 g/dL — ABNORMAL LOW (ref 3.5–5.2)
Alkaline Phosphatase: 165 U/L — ABNORMAL HIGH (ref 39–117)
BUN: 5 mg/dL — ABNORMAL LOW (ref 6–23)
Chloride: 104 mEq/L (ref 96–112)
Potassium: 2.8 mEq/L — ABNORMAL LOW (ref 3.5–5.1)
Total Bilirubin: 0.2 mg/dL — ABNORMAL LOW (ref 0.3–1.2)

## 2011-10-08 LAB — CBC
HCT: 35.3 % — ABNORMAL LOW (ref 36.0–46.0)
Platelets: 184 10*3/uL (ref 150–400)
RBC: 4.37 MIL/uL (ref 3.87–5.11)
RDW: 15 % (ref 11.5–15.5)
WBC: 5.7 10*3/uL (ref 4.0–10.5)

## 2011-10-08 NOTE — MAU Provider Note (Signed)
History     CSN: [id number]  Arrival date and time: 10/08/11 1635   First Provider Initiated Contact with Patient 10/08/11 1824      Chief Complaint  Patient presents with  . Hypertension   HPI Kimberly [name] is 36 y.o. [id number] [redacted]w[redacted]d weeks presenting for [facility] labs and monitoring/BPP.  Patient of DR. McCombs.  Patient states she is feeling well.  Hx of borderline blood pressures and low potassium.  BP reading here have been borderline systolic.     Past Medical History  Diagnosis Date  . Anxiety   . Depression   . Hypertension   . Tachycardia   . Hypokalemia     Potassium 2.7 August 02, 2011  . Abnormal Pap smear   . Chlamydia     Past Surgical History  Procedure Date  . Cesarean section     Family History  Problem Relation Age of Onset  . Other Neg Hx     History  Substance Use Topics  . Smoking status: Never Smoker   . Smokeless tobacco: Never Used  . Alcohol Use: No    Allergies: No Known Allergies  Prescriptions prior to admission  Medication Sig Dispense Refill  . acetaminophen (TYLENOL) 500 MG tablet Take 500 mg by mouth every 6 (six) hours as needed. For pain or headache      . cetirizine (ZYRTEC) 10 MG tablet Take 10 mg by mouth daily as needed. For allergies      . DM-Phenylephrine-Acetaminophen (TYLENOL COLD MULTI-SYMPTOM) 10-5-325 MG/15ML LIQD Take 10 mLs by mouth daily as needed. For cold symptoms      . Prenatal Vit-Fe Fumarate-FA (PRENATAL MULTIVITAMIN) TABS Take 1 tablet by mouth daily.        Review of Systems  Constitutional:       Borderline BP readings  Eyes: Negative for blurred vision and double vision.  Cardiovascular: Negative for chest pain.   Physical Exam   Blood pressure 151/71, pulse 107, temperature 97.5 F (36.4 C), temperature source Oral, resp. rate 18.  Physical Exam  Constitutional: She is oriented to person, place, and time. She appears well-developed and well-nourished. No distress.  Neurological: She is alert  and oriented to person, place, and time.  Psychiatric: She has a normal mood and affect. Her behavior is normal.   Results for orders placed during the hospital encounter of 10/08/11 (from the past 24 hour(s))  CBC     Status: Abnormal   Collection Time   10/08/11  5:00 PM      Component Value Range   WBC 5.7  4.0 - 10.5 K/uL   RBC 4.37  3.87 - 5.11 MIL/uL   Hemoglobin 11.5 (*) 12.0 - 15.0 g/dL   HCT 13.0 (*) 86.5 - 78.4 %   MCV 80.8  78.0 - 100.0 fL   MCH 26.3  26.0 - 34.0 pg   MCHC 32.6  30.0 - 36.0 g/dL   RDW 69.6  29.5 - 28.4 %   Platelets 184  150 - 400 K/uL  COMPREHENSIVE METABOLIC PANEL     Status: Abnormal   Collection Time   10/08/11  5:00 PM      Component Value Range   Sodium 137  135 - 145 mEq/L   Potassium 2.8 (*) 3.5 - 5.1 mEq/L   Chloride 104  96 - 112 mEq/L   CO2 20  19 - 32 mEq/L   Glucose, Bld 85  70 - 99 mg/dL   BUN 5 (*) 6 -  23 mg/dL   Creatinine, Ser 1.61  0.50 - 1.10 mg/dL   Calcium 9.5  8.4 - 09.6 mg/dL   Total Protein 6.9  6.0 - 8.3 g/dL   Albumin 2.5 (*) 3.5 - 5.2 g/dL   AST 18  0 - 37 U/L   ALT 13  0 - 35 U/L   Alkaline Phosphatase 165 (*) 39 - 117 U/L   Total Bilirubin 0.2 (*) 0.3 - 1.2 mg/dL   GFR calc non Af Amer >90  >90 mL/min   GFR calc Af Amer >90  >90 mL/min  LACTATE DEHYDROGENASE     Status: Normal   Collection Time   10/08/11  5:00 PM      Component Value Range   LDH 178  94 - 250 U/L  URIC ACID     Status: Normal   Collection Time   10/08/11  5:00 PM      Component Value Range   Uric Acid, Serum 5.1  2.4 - 7.0 mg/dL    FMS reactive tracing  BPP 8/8 MAU Course  Procedures  MDM   Assessment and Plan  A: PIH work-up  P:  Darl Pikes, RN reported labs to Dr. Henderson Cloud.  He gave discharge order  Matt Holmes 10/08/2011, 6:29 PM

## 2011-10-08 NOTE — MAU Note (Signed)
Went for routine prenatal visit and BP was elevated. Sent to MAU for further evaluation.

## 2011-10-08 NOTE — MAU Note (Signed)
Patient states her K level has been low before and she has oral K at home. Was advised to stop taking it after her level returned to normal range.

## 2011-10-18 ENCOUNTER — Inpatient Hospital Stay (HOSPITAL_COMMUNITY)
Admission: AD | Admit: 2011-10-18 | Discharge: 2011-10-18 | Disposition: A | Discharge status: Home / Self Care | Payer: 59 | Source: Ambulatory Visit | Attending: Obstetrics and Gynecology | Admitting: Obstetrics and Gynecology

## 2011-10-18 ENCOUNTER — Encounter (HOSPITAL_COMMUNITY): Payer: Self-pay | Admitting: *Deleted

## 2011-10-18 DIAGNOSIS — E876 Hypokalemia: Secondary | ICD-10-CM

## 2011-10-18 DIAGNOSIS — O99891 Other specified diseases and conditions complicating pregnancy: Secondary | ICD-10-CM | POA: Insufficient documentation

## 2011-10-18 DIAGNOSIS — R000 Tachycardia, unspecified: Secondary | ICD-10-CM

## 2011-10-18 DIAGNOSIS — O169 Unspecified maternal hypertension, unspecified trimester: Secondary | ICD-10-CM

## 2011-10-18 DIAGNOSIS — R030 Elevated blood-pressure reading, without diagnosis of hypertension: Secondary | ICD-10-CM | POA: Insufficient documentation

## 2011-10-18 LAB — COMPREHENSIVE METABOLIC PANEL
BUN: 6 mg/dL (ref 6–23)
Calcium: 10.1 mg/dL (ref 8.4–10.5)
Creatinine, Ser: 0.49 mg/dL — ABNORMAL LOW (ref 0.50–1.10)
GFR calc Af Amer: >90 mL/min (ref >90)
Glucose, Bld: 88 mg/dL (ref 70–99)
Sodium: 137 mEq/L (ref 135–145)
Total Protein: 7.1 g/dL (ref 6.0–8.3)

## 2011-10-18 LAB — CBC
HCT: 37.4 % (ref 36.0–46.0)
Hemoglobin: 12.3 g/dL (ref 12.0–15.0)
MCH: 26.7 pg (ref 26.0–34.0)
MCHC: 32.9 g/dL (ref 30.0–36.0)
MCV: 81.1 fL (ref 78.0–100.0)

## 2011-10-18 LAB — URINALYSIS, ROUTINE W REFLEX MICROSCOPIC
Nitrite: NEGATIVE
Specific Gravity, Urine: <1.005 — ABNORMAL LOW (ref 1.005–1.030)
Urobilinogen, UA: 0.2 mg/dL (ref 0.0–1.0)
pH: 6 (ref 5.0–8.0)

## 2011-10-18 LAB — LACTATE DEHYDROGENASE: LDH: 199 U/L (ref 94–250)

## 2011-10-18 LAB — URINE MICROSCOPIC-ADD ON

## 2011-10-18 NOTE — MAU Provider Note (Signed)
  History     CSN: [id number]  Arrival date and time: 10/18/11 1224   None     Chief Complaint  Patient presents with  . Hypertension   HPI     Past Medical History  Diagnosis Date  . Anxiety   . Depression   . Hypertension   . Tachycardia   . Hypokalemia     Potassium 2.7 August 02, 2011  . Abnormal Pap smear   . Chlamydia     Past Surgical History  Procedure Date  . Cesarean section     Family History  Problem Relation Age of Onset  . Other Neg Hx     History  Substance Use Topics  . Smoking status: Never Smoker   . Smokeless tobacco: Never Used  . Alcohol Use: No    Allergies: No Known Allergies  Prescriptions prior to admission  Medication Sig Dispense Refill  . acetaminophen (TYLENOL) 500 MG tablet Take 500 mg by mouth every 6 (six) hours as needed. For pain or headache      . cetirizine (ZYRTEC) 10 MG tablet Take 10 mg by mouth daily as needed. For allergies      . potassium chloride (K-DUR,KLOR-CON) 10 MEQ tablet Take 10 mEq by mouth 2 (two) times daily.      . Prenatal Vit-Fe Fumarate-FA (PRENATAL MULTIVITAMIN) TABS Take 1 tablet by mouth daily.        ROS Physical Exam   Blood pressure 131/77, pulse 115, temperature 98.5 F (36.9 C), temperature source Oral, resp. rate 18, height 5\' 5"  (1.651 m), weight 122.38 kg (269 lb 12.8 oz), SpO2 100.00%.  Physical Exam  Gravid uterus nontender bp's better  MAU Course  Procedures  MDM iup at 37 with elevted bp  Assessment and Plan  pih labs ok   bp better d/c home to rest. PIH warning signs given.  Aedon Deason S 10/18/2011, 3:47 PM

## 2011-10-18 NOTE — MAU Note (Signed)
Pt verbalizes undestanding 

## 2011-10-18 NOTE — MAU Note (Signed)
Patient sent from the office for evaluation of elevated blood pressure. Patient is scheduled for repeat cesarean section on 10-16.Patient denies any symptoms. No contractions, bleeding or leaking. Reports good fetal movement.

## 2011-10-18 NOTE — MAU Provider Note (Signed)
History     CSN: [id number]  Arrival date and time: 10/18/11 1224   None     Chief Complaint  Patient presents with  . Hypertension   HPI 36 y.o. [id number] at [redacted]w[redacted]d with elevated BPs, sent from office for further eval. Multiple PIH work ups in the last few weeks. Denies headache, vision changes, abd pain. + fetal movement.    Past Medical History  Diagnosis Date  . Anxiety   . Depression   . Hypertension   . Tachycardia   . Hypokalemia     Potassium 2.7 August 02, 2011  . Abnormal Pap smear   . Chlamydia     Past Surgical History  Procedure Date  . Cesarean section     Family History  Problem Relation Age of Onset  . Other Neg Hx     History  Substance Use Topics  . Smoking status: Never Smoker   . Smokeless tobacco: Never Used  . Alcohol Use: No    Allergies: No Known Allergies  Prescriptions prior to admission  Medication Sig Dispense Refill  . acetaminophen (TYLENOL) 500 MG tablet Take 500 mg by mouth every 6 (six) hours as needed. For pain or headache      . cetirizine (ZYRTEC) 10 MG tablet Take 10 mg by mouth daily as needed. For allergies      . potassium chloride (K-DUR,KLOR-CON) 10 MEQ tablet Take 10 mEq by mouth 2 (two) times daily.      . Prenatal Vit-Fe Fumarate-FA (PRENATAL MULTIVITAMIN) TABS Take 1 tablet by mouth daily.        Review of Systems  Constitutional: Negative.   Respiratory: Negative.   Cardiovascular: Negative.   Gastrointestinal: Negative for nausea, vomiting, abdominal pain, diarrhea and constipation.  Genitourinary: Negative for dysuria, urgency, frequency, hematuria and flank pain.       Negative for vaginal bleeding, cramping/contractions  Musculoskeletal: Negative.   Neurological: Negative.   Psychiatric/Behavioral: Negative.    Physical Exam   Blood pressure 142/79, pulse 110, temperature 98.1 F (36.7 C), temperature source Oral, resp. rate 20, height 5\' 5"  (1.651 m), weight 269 lb 12.8 oz (122.38 kg), SpO2  100.00%.  Physical Exam  Nursing note and vitals reviewed. Constitutional: She is oriented to person, place, and time. She appears well-developed and well-nourished. No distress.  Cardiovascular: Normal rate and regular rhythm.   Respiratory: Effort normal. No respiratory distress.  GI: Soft. There is no tenderness.  Musculoskeletal: Normal range of motion. She exhibits edema (BLE 1+).  Neurological: She is alert and oriented to person, place, and time. She has normal reflexes.  Skin: Skin is warm and dry.  Psychiatric: She has a normal mood and affect.   EFM reactive MAU Course  Procedures Results for orders placed during the hospital encounter of 10/18/11 (from the past 24 hour(s))  CBC     Status: Normal   Collection Time   10/18/11 12:55 PM      Component Value Range   WBC 8.0  4.0 - 10.5 K/uL   RBC 4.61  3.87 - 5.11 MIL/uL   Hemoglobin 12.3  12.0 - 15.0 g/dL   HCT 65.7  84.6 - 96.2 %   MCV 81.1  78.0 - 100.0 fL   MCH 26.7  26.0 - 34.0 pg   MCHC 32.9  30.0 - 36.0 g/dL   RDW 95.2  84.1 - 32.4 %   Platelets 181  150 - 400 K/uL  COMPREHENSIVE METABOLIC PANEL     Status:  Abnormal   Collection Time   10/18/11 12:55 PM      Component Value Range   Sodium 137  135 - 145 mEq/L   Potassium 3.5  3.5 - 5.1 mEq/L   Chloride 103  96 - 112 mEq/L   CO2 21  19 - 32 mEq/L   Glucose, Bld 88  70 - 99 mg/dL   BUN 6  6 - 23 mg/dL   Creatinine, Ser 4.54 (*) 0.50 - 1.10 mg/dL   Calcium 09.8  8.4 - 11.9 mg/dL   Total Protein 7.1  6.0 - 8.3 g/dL   Albumin 2.5 (*) 3.5 - 5.2 g/dL   AST 15  0 - 37 U/L   ALT 11  0 - 35 U/L   Alkaline Phosphatase 210 (*) 39 - 117 U/L   Total Bilirubin 0.2 (*) 0.3 - 1.2 mg/dL   GFR calc non Af Amer >90  >90 mL/min   GFR calc Af Amer >90  >90 mL/min  URINALYSIS, ROUTINE W REFLEX MICROSCOPIC     Status: Abnormal   Collection Time   10/18/11 12:55 PM      Component Value Range   Color, Urine YELLOW  YELLOW   APPearance HAZY (*) CLEAR   Specific Gravity, Urine  <1.005 (*) 1.005 - 1.030   pH 6.0  5.0 - 8.0   Glucose, UA NEGATIVE  NEGATIVE mg/dL   Hgb urine dipstick NEGATIVE  NEGATIVE   Bilirubin Urine NEGATIVE  NEGATIVE   Ketones, ur NEGATIVE  NEGATIVE mg/dL   Protein, ur NEGATIVE  NEGATIVE mg/dL   Urobilinogen, UA 0.2  0.0 - 1.0 mg/dL   Nitrite NEGATIVE  NEGATIVE   Leukocytes, UA SMALL (*) NEGATIVE  URIC ACID     Status: Normal   Collection Time   10/18/11 12:55 PM      Component Value Range   Uric Acid, Serum 4.6  2.4 - 7.0 mg/dL  LACTATE DEHYDROGENASE     Status: Normal   Collection Time   10/18/11 12:55 PM      Component Value Range   LDH 199  94 - 250 U/L  URINE MICROSCOPIC-ADD ON     Status: Abnormal   Collection Time   10/18/11 12:55 PM      Component Value Range   Squamous Epithelial / LPF RARE  RARE   WBC, UA 3-6  <3 WBC/hpf   RBC / HPF 0-2  <3 RBC/hpf   Bacteria, UA FEW (*) RARE     Assessment and Plan  36 y.o. [id number] at [redacted]w[redacted]d PIH Continue to monitor, Dr. Arelia Sneddon will come to MAU to see patient  Mannat Benedetti 10/18/2011, 1:42 PM

## 2011-10-19 ENCOUNTER — Encounter (HOSPITAL_COMMUNITY): Payer: Self-pay | Admitting: *Deleted

## 2011-10-19 ENCOUNTER — Encounter (HOSPITAL_COMMUNITY): Payer: Self-pay

## 2011-10-21 MED ORDER — CEFOTETAN DISODIUM 2 G IJ SOLR
2.0000 g | INTRAMUSCULAR | Status: AC
  Administered 2011-10-22: 2 g via INTRAVENOUS
  Filled 2011-10-21: qty 2

## 2011-10-21 NOTE — H&P (Addendum)
Kimberly [name] is a 36 y.o. female presenting for repeat Cesarean section and BTL. Pregnancy complicated by Gestational Hypertension on bedrest for the last 3 weeks with BPs 140-150s/90-100s with [facility] Preecl[facility].  Previous pregnancy was complicated by 36 weeks abruption and emergent cesarean section.  Pt now presents for repeat C/S and desires sterilzation.  She has also had preterm labor and now with early labor sxs. Marland Kitchen History OB History    Grav Para Term Preterm Abortions TAB SAB Ect Mult Living   3 1  1 1 1    1      Past Medical History  Diagnosis Date  . Depression   . Tachycardia   . Hypokalemia     Potassium 2.7 August 02, 2011  . Abnormal Pap smear   . Chlamydia   . Anxiety     no meds during pregnancy  . Gastroesophageal reflux in pregancy     ocasional -  . Hypertension     PIH - No meds   Past Surgical History  Procedure Date  . Cesarean section 2005    x 1  . Wisdom tooth extraction    Family History: family history is negative for Other. Social History:  reports that she has never smoked. She has never used smokeless tobacco. She reports that she does not drink alcohol or use illicit drugs.   Prenatal Transfer Tool  Maternal Diabetes: No Genetic Screening: Normal Maternal Ultrasounds/Referrals: Normal Fetal Ultrasounds or ot. her Referrals:  None Maternal Substance Abuse:  No Significant Maternal Medications:  None Significant Maternal Lab Results:  None Other Comments:  None  ROS    Height 5\' 4"  (1.626 m), weight 121.11 kg (267 lb). Exam Physical Exam   Cx Cl / 50 /-2 DTRs 2/4 1+ edema  Prenatal labs: ABO, Rh: A/Negative/-- (03/15 0000) Antibody: Negative (03/15 0000) Rubella: Immune (03/15 0000) RPR: Nonreactive (03/15 0000)  HBsAg: Negative (03/15 0000)  HIV: Non-reactive (03/15 0000)  GBS:     Assessment/Plan: IUP at 38 weeks with Gest HTN Pt desires repeat C/S and desires sterility Risks and  benefits of C/S and BTL were discussed.  All questions were answered and informed consent was obtained.  Plan to proceed with low segment transverse Cesarean Section.    [name] C 10/21/2011, 8:38 PM  This patient has been seen and examined.   All of her questions were answered.  Labs and vital signs reviewed.  Informed consent has been obtained.  The History and Physical is current 10/22/11 1200 DL

## 2011-10-22 ENCOUNTER — Inpatient Hospital Stay (HOSPITAL_COMMUNITY)
Admission: AD | Admit: 2011-10-22 | Discharge: 2011-10-24 | DRG: 765 | Disposition: A | Discharge status: Home / Self Care | Payer: 59 | Source: Ambulatory Visit | Attending: Obstetrics and Gynecology | Admitting: Obstetrics and Gynecology

## 2011-10-22 ENCOUNTER — Encounter (HOSPITAL_COMMUNITY)
Admission: AD | Disposition: A | Discharge status: Home / Self Care | Payer: Self-pay | Source: Ambulatory Visit | Attending: Obstetrics and Gynecology

## 2011-10-22 ENCOUNTER — Encounter (HOSPITAL_COMMUNITY): Payer: Self-pay | Admitting: *Deleted

## 2011-10-22 ENCOUNTER — Inpatient Hospital Stay (HOSPITAL_COMMUNITY): Payer: 59 | Admitting: Anesthesiology

## 2011-10-22 ENCOUNTER — Encounter (HOSPITAL_COMMUNITY): Payer: Self-pay | Admitting: Anesthesiology

## 2011-10-22 DIAGNOSIS — O09529 Supervision of elderly multigravida, unspecified trimester: Secondary | ICD-10-CM | POA: Diagnosis present

## 2011-10-22 DIAGNOSIS — O34219 Maternal care for unspecified type scar from previous cesarean delivery: Principal | ICD-10-CM | POA: Diagnosis present

## 2011-10-22 DIAGNOSIS — R000 Tachycardia, unspecified: Secondary | ICD-10-CM

## 2011-10-22 DIAGNOSIS — Z302 Encounter for sterilization: Secondary | ICD-10-CM

## 2011-10-22 DIAGNOSIS — E876 Hypokalemia: Secondary | ICD-10-CM

## 2011-10-22 DIAGNOSIS — O139 Gestational [pregnancy-induced] hypertension without significant proteinuria, unspecified trimester: Secondary | ICD-10-CM | POA: Diagnosis present

## 2011-10-22 HISTORY — DX: Gastro-esophageal reflux disease without esophagitis: K21.9

## 2011-10-22 HISTORY — DX: Gastro-esophageal reflux disease without esophagitis: O99.619

## 2011-10-22 LAB — COMPREHENSIVE METABOLIC PANEL
ALT: 9 U/L (ref 0–35)
AST: 14 U/L (ref 0–37)
Albumin: 2.5 g/dL — ABNORMAL LOW (ref 3.5–5.2)
CO2: 20 mEq/L (ref 19–32)
Calcium: 9.2 mg/dL (ref 8.4–10.5)
Chloride: 103 mEq/L (ref 96–112)
GFR calc non Af Amer: >90 mL/min (ref >90)
Sodium: 136 mEq/L (ref 135–145)

## 2011-10-22 LAB — CBC
MCH: 26.6 pg (ref 26.0–34.0)
Platelets: 184 10*3/uL (ref 150–400)
RBC: 4.55 MIL/uL (ref 3.87–5.11)
RDW: 15.3 % (ref 11.5–15.5)
WBC: 7.9 10*3/uL (ref 4.0–10.5)

## 2011-10-22 LAB — SURGICAL PCR SCREEN
MRSA, PCR: NEGATIVE
Staphylococcus aureus: NEGATIVE

## 2011-10-22 LAB — RPR: RPR Ser Ql: NONREACTIVE

## 2011-10-22 SURGERY — Surgical Case
Anesthesia: Spinal | Site: Abdomen | Wound class: Clean Contaminated

## 2011-10-22 MED ORDER — SIMETHICONE 80 MG PO CHEW: 80.0000 mg | CHEWABLE_TABLET | ORAL | Status: DC | PRN

## 2011-10-22 MED ORDER — PROMETHAZINE HCL 25 MG/ML IJ SOLN: 6.2500 mg | INTRAMUSCULAR | Status: DC | PRN

## 2011-10-22 MED ORDER — DIPHENHYDRAMINE HCL 25 MG PO CAPS: 25.0000 mg | ORAL_CAPSULE | Freq: Four times a day (QID) | ORAL | Status: DC | PRN

## 2011-10-22 MED ORDER — DIBUCAINE 1 % RE OINT: 1.0000 "application " | TOPICAL_OINTMENT | RECTAL | Status: DC | PRN

## 2011-10-22 MED ORDER — BUPIVACAINE IN DEXTROSE 0.75-8.25 % IT SOLN
INTRATHECAL | Status: DC | PRN
  Administered 2011-10-22: 1.5 mL via INTRATHECAL

## 2011-10-22 MED ORDER — ZOLPIDEM TARTRATE 5 MG PO TABS: 5.0000 mg | ORAL_TABLET | Freq: Every evening | ORAL | Status: DC | PRN

## 2011-10-22 MED ORDER — OXYCODONE-ACETAMINOPHEN 5-325 MG PO TABS
1.0000 | ORAL_TABLET | ORAL | Status: DC | PRN
  Administered 2011-10-23: 1 via ORAL
  Filled 2011-10-22: qty 1

## 2011-10-22 MED ORDER — PRENATAL MULTIVITAMIN CH
1.0000 | ORAL_TABLET | Freq: Every day | ORAL | Status: DC
  Administered 2011-10-23 – 2011-10-24 (×2): 1 via ORAL
  Filled 2011-10-22 (×2): qty 1

## 2011-10-22 MED ORDER — FENTANYL CITRATE 0.05 MG/ML IJ SOLN: 25.0000 ug | INTRAMUSCULAR | Status: DC | PRN

## 2011-10-22 MED ORDER — PRENATAL MULTIVITAMIN CH: 1.0000 | ORAL_TABLET | Freq: Every day | ORAL | Status: DC

## 2011-10-22 MED ORDER — MENTHOL 3 MG MT LOZG: 1.0000 | LOZENGE | OROMUCOSAL | Status: DC | PRN

## 2011-10-22 MED ORDER — SCOPOLAMINE 1 MG/3DAYS TD PT72: 1.0000 | MEDICATED_PATCH | Freq: Once | TRANSDERMAL | Status: DC

## 2011-10-22 MED ORDER — PHENYLEPHRINE 40 MCG/ML (10ML) SYRINGE FOR IV PUSH (FOR BLOOD PRESSURE SUPPORT)
PREFILLED_SYRINGE | INTRAVENOUS | Status: AC
  Filled 2011-10-22: qty 5

## 2011-10-22 MED ORDER — ONDANSETRON HCL 4 MG/2ML IJ SOLN
INTRAMUSCULAR | Status: DC | PRN
  Administered 2011-10-22: 4 mg via INTRAVENOUS

## 2011-10-22 MED ORDER — NALBUPHINE HCL 10 MG/ML IJ SOLN: 5.0000 mg | INTRAMUSCULAR | Status: DC | PRN

## 2011-10-22 MED ORDER — SENNOSIDES-DOCUSATE SODIUM 8.6-50 MG PO TABS
2.0000 | ORAL_TABLET | Freq: Every day | ORAL | Status: DC
  Administered 2011-10-22 – 2011-10-23 (×2): 2 via ORAL

## 2011-10-22 MED ORDER — FENTANYL CITRATE 0.05 MG/ML IJ SOLN
INTRAMUSCULAR | Status: DC | PRN
  Administered 2011-10-22: 25 ug via INTRATHECAL

## 2011-10-22 MED ORDER — MEPERIDINE HCL 25 MG/ML IJ SOLN: 6.2500 mg | INTRAMUSCULAR | Status: DC | PRN

## 2011-10-22 MED ORDER — ONDANSETRON HCL 4 MG/2ML IJ SOLN
INTRAMUSCULAR | Status: AC
  Filled 2011-10-22: qty 2

## 2011-10-22 MED ORDER — KETOROLAC TROMETHAMINE 30 MG/ML IJ SOLN
30.0000 mg | Freq: Four times a day (QID) | INTRAMUSCULAR | Status: AC | PRN
  Administered 2011-10-22: 30 mg via INTRAVENOUS

## 2011-10-22 MED ORDER — SODIUM CHLORIDE 0.9 % IJ SOLN: 3.0000 mL | INTRAMUSCULAR | Status: DC | PRN

## 2011-10-22 MED ORDER — ONDANSETRON HCL 4 MG/2ML IJ SOLN: 4.0000 mg | Freq: Three times a day (TID) | INTRAMUSCULAR | Status: DC | PRN

## 2011-10-22 MED ORDER — IBUPROFEN 600 MG PO TABS
600.0000 mg | ORAL_TABLET | Freq: Four times a day (QID) | ORAL | Status: DC
  Administered 2011-10-23 – 2011-10-24 (×6): 600 mg via ORAL
  Filled 2011-10-22 (×6): qty 1

## 2011-10-22 MED ORDER — KETOROLAC TROMETHAMINE 30 MG/ML IJ SOLN: 30.0000 mg | Freq: Four times a day (QID) | INTRAMUSCULAR | Status: AC | PRN

## 2011-10-22 MED ORDER — PHENYLEPHRINE HCL 10 MG/ML IJ SOLN
INTRAMUSCULAR | Status: DC | PRN
  Administered 2011-10-22 (×4): 40 ug via INTRAVENOUS
  Administered 2011-10-22 (×2): 80 ug via INTRAVENOUS

## 2011-10-22 MED ORDER — LANOLIN HYDROUS EX OINT: 1.0000 "application " | TOPICAL_OINTMENT | CUTANEOUS | Status: DC | PRN

## 2011-10-22 MED ORDER — EPHEDRINE 5 MG/ML INJ
INTRAVENOUS | Status: AC
  Filled 2011-10-22: qty 10

## 2011-10-22 MED ORDER — DIPHENHYDRAMINE HCL 25 MG PO CAPS: 25.0000 mg | ORAL_CAPSULE | ORAL | Status: DC | PRN

## 2011-10-22 MED ORDER — ACETAMINOPHEN 10 MG/ML IV SOLN: 1000.0000 mg | Freq: Four times a day (QID) | INTRAVENOUS | Status: AC | PRN

## 2011-10-22 MED ORDER — ONDANSETRON HCL 4 MG/2ML IJ SOLN: 4.0000 mg | INTRAMUSCULAR | Status: DC | PRN

## 2011-10-22 MED ORDER — LACTATED RINGERS IV SOLN
INTRAVENOUS | Status: DC
  Administered 2011-10-22: 23:00:00 via INTRAVENOUS

## 2011-10-22 MED ORDER — DIPHENHYDRAMINE HCL 50 MG/ML IJ SOLN: 12.5000 mg | INTRAMUSCULAR | Status: DC | PRN

## 2011-10-22 MED ORDER — DIPHENHYDRAMINE HCL 50 MG/ML IJ SOLN: 25.0000 mg | INTRAMUSCULAR | Status: DC | PRN

## 2011-10-22 MED ORDER — EPHEDRINE SULFATE 50 MG/ML IJ SOLN
INTRAMUSCULAR | Status: DC | PRN
  Administered 2011-10-22 (×2): 10 mg via INTRAVENOUS

## 2011-10-22 MED ORDER — MIDAZOLAM HCL 2 MG/2ML IJ SOLN: 0.5000 mg | Freq: Once | INTRAMUSCULAR | Status: DC | PRN

## 2011-10-22 MED ORDER — MORPHINE SULFATE (PF) 0.5 MG/ML IJ SOLN
INTRAMUSCULAR | Status: DC | PRN
  Administered 2011-10-22: .1 mg via INTRATHECAL

## 2011-10-22 MED ORDER — ONDANSETRON HCL 4 MG PO TABS: 4.0000 mg | ORAL_TABLET | ORAL | Status: DC | PRN

## 2011-10-22 MED ORDER — OXYTOCIN 40 UNITS IN LACTATED RINGERS INFUSION - SIMPLE MED: 62.5000 mL/h | INTRAVENOUS | Status: AC

## 2011-10-22 MED ORDER — SCOPOLAMINE 1 MG/3DAYS TD PT72
MEDICATED_PATCH | TRANSDERMAL | Status: AC
  Administered 2011-10-22: 1.5 mg via TRANSDERMAL
  Filled 2011-10-22: qty 1

## 2011-10-22 MED ORDER — SIMETHICONE 80 MG PO CHEW
80.0000 mg | CHEWABLE_TABLET | Freq: Three times a day (TID) | ORAL | Status: DC
  Administered 2011-10-22 – 2011-10-24 (×6): 80 mg via ORAL

## 2011-10-22 MED ORDER — OXYTOCIN 10 UNIT/ML IJ SOLN
40.0000 [IU] | INTRAVENOUS | Status: DC | PRN
  Administered 2011-10-22: 40 [IU] via INTRAVENOUS

## 2011-10-22 MED ORDER — SODIUM CHLORIDE 0.9 % IV SOLN: 1.0000 ug/kg/h | INTRAVENOUS | Status: DC | PRN

## 2011-10-22 MED ORDER — TETANUS-DIPHTH-ACELL PERTUSSIS 5-2.5-18.5 LF-MCG/0.5 IM SUSP: 0.5000 mL | Freq: Once | INTRAMUSCULAR | Status: DC

## 2011-10-22 MED ORDER — KETOROLAC TROMETHAMINE 30 MG/ML IJ SOLN
INTRAMUSCULAR | Status: AC
  Administered 2011-10-22: 30 mg via INTRAVENOUS
  Filled 2011-10-22: qty 1

## 2011-10-22 MED ORDER — SCOPOLAMINE 1 MG/3DAYS TD PT72
1.0000 | MEDICATED_PATCH | Freq: Once | TRANSDERMAL | Status: DC
  Administered 2011-10-22: 1.5 mg via TRANSDERMAL

## 2011-10-22 MED ORDER — OXYTOCIN 10 UNIT/ML IJ SOLN
INTRAMUSCULAR | Status: AC
  Filled 2011-10-22: qty 4

## 2011-10-22 MED ORDER — INFLUENZA VIRUS VACC SPLIT PF IM SUSP
0.5000 mL | INTRAMUSCULAR | Status: AC
  Administered 2011-10-23: 0.5 mL via INTRAMUSCULAR

## 2011-10-22 MED ORDER — NALOXONE HCL 0.4 MG/ML IJ SOLN: 0.4000 mg | INTRAMUSCULAR | Status: DC | PRN

## 2011-10-22 MED ORDER — MORPHINE SULFATE 0.5 MG/ML IJ SOLN
INTRAMUSCULAR | Status: AC
  Filled 2011-10-22: qty 10

## 2011-10-22 MED ORDER — METOCLOPRAMIDE HCL 5 MG/ML IJ SOLN: 10.0000 mg | Freq: Three times a day (TID) | INTRAMUSCULAR | Status: DC | PRN

## 2011-10-22 MED ORDER — LACTATED RINGERS IV SOLN
INTRAVENOUS | Status: DC
  Administered 2011-10-22 (×3): via INTRAVENOUS

## 2011-10-22 MED ORDER — FENTANYL CITRATE 0.05 MG/ML IJ SOLN
INTRAMUSCULAR | Status: AC
  Filled 2011-10-22: qty 2

## 2011-10-22 MED ORDER — WITCH HAZEL-GLYCERIN EX PADS: 1.0000 "application " | MEDICATED_PAD | CUTANEOUS | Status: DC | PRN

## 2011-10-22 SURGICAL SUPPLY — 32 items
CLIP FILSHIE TUBAL LIGA STRL (Clip) ×1 IMPLANT
CLOTH BEACON ORANGE TIMEOUT ST (SAFETY) ×2 IMPLANT
DRAPE SURG 17X23 STRL (DRAPES) ×2 IMPLANT
DRESSING TELFA 8X3 (GAUZE/BANDAGES/DRESSINGS) IMPLANT
DRSG COVADERM 4X10 (GAUZE/BANDAGES/DRESSINGS) ×1 IMPLANT
DURAPREP 26ML APPLICATOR (WOUND CARE) ×2 IMPLANT
ELECT REM PT RETURN 9FT ADLT (ELECTROSURGICAL) ×2
ELECTRODE REM PT RTRN 9FT ADLT (ELECTROSURGICAL) ×1 IMPLANT
EXTRACTOR VACUUM M CUP 4 TUBE (SUCTIONS) IMPLANT
GAUZE SPONGE 4X4 12PLY STRL LF (GAUZE/BANDAGES/DRESSINGS) IMPLANT
GLOVE BIO SURGEON STRL SZ8 (GLOVE) ×2 IMPLANT
GLOVE SURG ORTHO 8.0 STRL STRW (GLOVE) ×2 IMPLANT
GOWN PREVENTION PLUS LG XLONG (DISPOSABLE) ×4 IMPLANT
KIT ABG SYR 3ML LUER SLIP (SYRINGE) ×2 IMPLANT
NDL HYPO 25X5/8 SAFETYGLIDE (NEEDLE) ×1 IMPLANT
NEEDLE HYPO 25X5/8 SAFETYGLIDE (NEEDLE) ×2 IMPLANT
NS IRRIG 1000ML POUR BTL (IV SOLUTION) ×2 IMPLANT
PACK C SECTION WH (CUSTOM PROCEDURE TRAY) ×2 IMPLANT
PAD ABD 7.5X8 STRL (GAUZE/BANDAGES/DRESSINGS) IMPLANT
PAD OB MATERNITY 4.3X12.25 (PERSONAL CARE ITEMS) ×1 IMPLANT
SLEEVE SCD COMPRESS KNEE MED (MISCELLANEOUS) ×1 IMPLANT
STAPLER VISISTAT 35W (STAPLE) IMPLANT
SUT MNCRL 0 VIOLET CTX 36 (SUTURE) ×3 IMPLANT
SUT MONOCRYL 0 CTX 36 (SUTURE) ×3
SUT PDS AB 0 CTX 60 (SUTURE) ×1 IMPLANT
SUT PDS AB 1 CT  36 (SUTURE)
SUT PDS AB 1 CT 36 (SUTURE) IMPLANT
SUT VIC AB 1 CTX 36 (SUTURE)
SUT VIC AB 1 CTX36XBRD ANBCTRL (SUTURE) IMPLANT
TOWEL OR 17X24 6PK STRL BLUE (TOWEL DISPOSABLE) ×4 IMPLANT
TRAY FOLEY CATH 14FR (SET/KITS/TRAYS/PACK) ×2 IMPLANT
WATER STERILE IRR 1000ML POUR (IV SOLUTION) ×2 IMPLANT

## 2011-10-22 NOTE — Op Note (Signed)
Cesarean Section Procedure Note  Pre-operative Diagnosis: IUP at 38 weeks, Gestational HTN, previous C/S for repeat, desires sterility  Post-operative Diagnosis: same  Surgeon: Turner Daniels   Assistants: none  Anesthesia:spinal  Procedure:  Low Segment Transverse cesarean section  Procedure Details  The patient was seen in the Holding Room. The risks, benefits, complications, treatment options, and expected outcomes were discussed with the patient.  The patient concurred with the proposed plan, giving informed consent.  The site of surgery properly noted/marked.. A Time Out was held and the above information confirmed.  After induction of anesthesia, the patient was draped and prepped in the usual sterile manner. A Pfannenstiel incision was made and carried down through the subcutaneous tissue to the fascia. Fascial incision was made and extended transversely. The fascia was separated from the underlying rectus tissue superiorly and inferiorly. The peritoneum was identified and entered. Peritoneal incision was extended longitudinally. The utero-vesical peritoneal reflection was incised transversely and the bladder flap was bluntly freed from the lower uterine segment. A low transverse uterine incision was made. Delivered from vertex presentation was a baby with Apgar scores of 9 at one minute and 9 at five minutes. After the umbilical cord was clamped and cut cord blood was obtained for evaluation. The placenta was removed intact and appeared normal. The uterine outline, tubes and ovaries appeared normal. The uterine incision was closed with running locked sutures of 0 monocryl and imbricated with 0 monocryl. Hemostasis was observed.  The fallopian tubes were identified by their fimbriated ends and filshie clips were placed across the tubes at the midportion of each tube.  Good placement was noted bilaterally.  . Lavage was carried out until clear. The peritoneum was then closed with 0 monocryl and  rectus muscles plicated in the midline.  After hemostasis was assured, the fascia was then reapproximated with running sutures of 1 PDS double stranded Irrigation was applied and after adequate hemostasis was assured, the skin was reapproximated with staples.  Instrument, sponge, and needle counts were correct prior the abdominal closure and at the conclusion of the case. The patient received 2 grams cefotetan preoperatively.  Findings: Viable female  Estimated Blood Loss:  800cc         Specimens: Placenta was sent to L&D         Complications:  None

## 2011-10-22 NOTE — Anesthesia Preprocedure Evaluation (Signed)
Anesthesia Evaluation  Patient identified by MRN, date of birth, ID band Patient awake    Reviewed: Allergy & Precautions, H&P , NPO status , Patient's Chart, lab work & pertinent test results  Airway Mallampati: III      Dental No notable dental hx.    Pulmonary neg pulmonary ROS,  breath sounds clear to auscultation  Pulmonary exam normal       Cardiovascular Exercise Tolerance: Good hypertension, negative cardio ROS  Rhythm:regular Rate:Normal     Neuro/Psych negative neurological ROS  negative psych ROS   GI/Hepatic negative GI ROS, Neg liver ROS,   Endo/Other  negative endocrine ROSMorbid obesity  Renal/GU negative Renal ROS  negative genitourinary   Musculoskeletal   Abdominal Normal abdominal exam  (+)   Peds  Hematology negative hematology ROS (+)   Anesthesia Other Findings Depression     Tachycardia        Hypokalemia   Potassium 2.7 August 02, 2011 Abnormal Pap smear        Chlamydia     Anxiety   no meds during pregnancy    Gastroesophageal reflux in pregancy   ocasional - Hypertension    Reproductive/Obstetrics (+) Pregnancy                           Anesthesia Physical Anesthesia Plan  ASA: III  Anesthesia Plan: Spinal   Post-op Pain Management:    Induction:   Airway Management Planned:   Additional Equipment:   Intra-op Plan:   Post-operative Plan:   Informed Consent: I have reviewed the patients History and Physical, chart, labs and discussed the procedure including the risks, benefits and alternatives for the proposed anesthesia with the patient or authorized representative who has indicated his/her understanding and acceptance.     Plan Discussed with: Anesthesiologist, CRNA and Surgeon  Anesthesia Plan Comments:         Anesthesia Quick Evaluation

## 2011-10-22 NOTE — Anesthesia Procedure Notes (Signed)
Spinal  Patient location during procedure: OR Start time: 10/22/2011 1:51 PM Staffing Anesthesiologist: Brayton Caves R Performed by: anesthesiologist  Preanesthetic Checklist Completed: patient identified, site marked, surgical consent, pre-op evaluation, timeout performed, IV checked, risks and benefits discussed and monitors and equipment checked Spinal Block Patient position: sitting Prep: DuraPrep Patient monitoring: heart rate, cardiac monitor, continuous pulse ox and blood pressure Approach: midline Location: L3-4 Injection technique: single-shot Needle Needle type: Sprotte  Needle gauge: 24 G Needle length: 9 cm Assessment Sensory level: T4 Additional Notes Patient identified.  Risk benefits discussed including failed block, incomplete pain control, headache, nerve damage, paralysis, blood pressure changes, nausea, vomiting, reactions to medication both toxic or allergic, and postpartum back pain.  Patient expressed understanding and wished to proceed.  All questions were answered.  Sterile technique used throughout procedure.  CSF was clear.  No parasthesia or other complications.  Please see nursing notes for vital signs.

## 2011-10-22 NOTE — Anesthesia Postprocedure Evaluation (Signed)
Anesthesia Post Note  Patient: Kimberly [name]  Procedure(s) Performed: Procedure(s) (LRB): CESAREAN SECTION WITH BILATERAL TUBAL LIGATION (N/A)  Anesthesia type: Spinal  Patient location: PACU  Post pain: Pain level controlled  Post assessment: Post-op Vital signs reviewed  Last Vitals:  Filed Vitals:   10/22/11 1530  BP: 115/88  Pulse: 112  Temp:   Resp: 20    Post vital signs: Reviewed  Level of consciousness: awake  Complications: No apparent anesthesia complications

## 2011-10-22 NOTE — Transfer of Care (Signed)
Immediate Anesthesia Transfer of Care Note  Patient: Kimberly [name]  Procedure(s) Performed: Procedure(s) (LRB) with comments: CESAREAN SECTION WITH BILATERAL TUBAL LIGATION (N/A) - edc 11/08/11 repeat  Patient Location: PACU  Anesthesia Type: Spinal  Level of Consciousness: awake  Airway & Oxygen Therapy: Patient Spontanous Breathing  Post-op Assessment: Report given to PACU RN  Post vital signs: Reviewed and stable  Complications: No apparent anesthesia complications

## 2011-10-23 ENCOUNTER — Inpatient Hospital Stay (HOSPITAL_COMMUNITY): Admission: RE | Admit: 2011-10-23 | Discharge: 2011-10-23 | Payer: 59 | Source: Ambulatory Visit

## 2011-10-23 LAB — CBC
MCH: 26.8 pg (ref 26.0–34.0)
MCHC: 32.6 g/dL (ref 30.0–36.0)
MCV: 82.2 fL (ref 78.0–100.0)
Platelets: 149 10*3/uL — ABNORMAL LOW (ref 150–400)

## 2011-10-23 MED ORDER — RHO D IMMUNE GLOBULIN 1500 UNIT/2ML IJ SOLN
300.0000 ug | Freq: Once | INTRAMUSCULAR | Status: AC
  Administered 2011-10-23: 300 ug via INTRAMUSCULAR
  Filled 2011-10-23: qty 2

## 2011-10-23 NOTE — Progress Notes (Signed)
UR Chart review completed.  

## 2011-10-23 NOTE — Progress Notes (Signed)
Subjective: Postpartum Day 1: Cesarean Delivery Patient reports tolerating PO and + flatus.  Denies HA, RUQ pain  Objective: Vital signs in last 24 hours: Temp:  [97.4 F (36.3 C)-98.7 F (37.1 C)] 97.5 F (36.4 C) (10/08 0630) Pulse Rate:  [80-121] 80  (10/08 0630) Resp:  [18-20] 18  (10/08 0630) BP: (109-160)/(51-105) 160/82 mmHg (10/08 0630) SpO2:  [95 %-100 %] 96 % (10/08 0630)  Physical Exam:  General: alert and cooperative Lochia: appropriate Uterine Fundus: firm Incision: abd dressing CDI DVT Evaluation: No evidence of DVT seen on physical exam. Negative Homan's sign. No cords or calf tenderness. DTR's 2+ small pedal edema  Basename 10/23/11 0553 10/22/11 1124  HGB 10.7* 12.1  HCT 32.8* 37.2    Assessment/Plan: Status post Cesarean section. Postoperative course complicated by [facility]  Continue current care.  Tatem Fesler G 10/23/2011, 8:20 AM

## 2011-10-24 ENCOUNTER — Encounter (HOSPITAL_COMMUNITY)
Admission: AD | Admit: 2011-10-24 | Discharge: 2011-10-24 | Disposition: A | Discharge status: Home / Self Care | Payer: 59 | Source: Ambulatory Visit | Attending: Obstetrics and Gynecology | Admitting: Obstetrics and Gynecology

## 2011-10-24 LAB — RH IG WORKUP (INCLUDES ABO/RH)
Gestational Age(Wks): 37.4
Unit division: 0

## 2011-10-24 LAB — COMPREHENSIVE METABOLIC PANEL
ALT: 11 U/L (ref 0–35)
AST: 19 U/L (ref 0–37)
CO2: 20 mEq/L (ref 19–32)
Chloride: 106 mEq/L (ref 96–112)
Creatinine, Ser: 0.62 mg/dL (ref 0.50–1.10)
GFR calc non Af Amer: >90 mL/min (ref >90)
Glucose, Bld: 113 mg/dL — ABNORMAL HIGH (ref 70–99)
Total Bilirubin: 0.2 mg/dL — ABNORMAL LOW (ref 0.3–1.2)

## 2011-10-24 LAB — CBC
Hemoglobin: 10.5 g/dL — ABNORMAL LOW (ref 12.0–15.0)
MCV: 82.7 fL (ref 78.0–100.0)
Platelets: 178 10*3/uL (ref 150–400)
RBC: 3.99 MIL/uL (ref 3.87–5.11)
WBC: 8.8 10*3/uL (ref 4.0–10.5)

## 2011-10-24 LAB — TYPE AND SCREEN
Antibody Screen: NEGATIVE
Unit division: 0

## 2011-10-24 MED ORDER — IBUPROFEN 600 MG PO TABS: 600.0000 mg | ORAL_TABLET | Freq: Four times a day (QID) | ORAL | Status: DC

## 2011-10-24 MED ORDER — OXYCODONE-ACETAMINOPHEN 5-325 MG PO TABS: 1.0000 | ORAL_TABLET | ORAL | Status: DC | PRN

## 2011-10-24 NOTE — Discharge Summary (Signed)
Obstetric Discharge Summary Reason for Admission: cesarean section Prenatal Procedures: ultrasound Intrapartum Procedures: cesarean: low cervical, transverse and tubal ligation Postpartum Procedures: none Complications-Operative and Postpartum: none Hemoglobin  Date Value Range Status  10/23/2011 10.7* 12.0 - 15.0 g/dL Final     HCT  Date Value Range Status  10/23/2011 32.8* 36.0 - 46.0 % Final    Physical Exam:  General: alert and cooperative Lochia: appropriate Uterine Fundus: firm Incision: healing well, staples intact DVT Evaluation: No evidence of DVT seen on physical exam. No significant calf/ankle edema.  Discharge Diagnoses: Term Pregnancy-delivered and Ghtn  Discharge Information: Date: 10/24/2011 Activity: pelvic rest Diet: routine Medications: PNV, Ibuprofen and Percocet Condition: stable Instructions: refer to practice specific booklet Discharge to: home   Newborn Data: Live born female  Birth Weight: 6 lb 11.4 oz (3045 g) APGAR: 7, 9  Home with mother.  Raven Furnas G 10/24/2011, 8:49 AM

## 2011-10-24 NOTE — Progress Notes (Signed)
Subjective: Postpartum Day 2: Cesarean Delivery Patient reports tolerating PO, + flatus, + BM and no problems voiding.  Denies HA, blurred vision or RUQ pain, Desires early discharge  Objective: Vital signs in last 24 hours: Temp:  [98 F (36.7 C)-98.5 F (36.9 C)] 98.5 F (36.9 C) (10/09 0552) Pulse Rate:  [99-111] 105  (10/09 0552) Resp:  [18-20] 18  (10/09 0552) BP: (137-146)/(83-97) 146/97 mmHg (10/09 0552) SpO2:  [94 %] 94 % (10/08 1020)  Physical Exam:  General: alert and cooperative Lochia: appropriate Uterine Fundus: firm Incision: healing well, staples intact DVT Evaluation: No evidence of DVT seen on physical exam. Negative Homan's sign. No cords or calf tenderness. No significant calf/ankle edema. DTR's 1+  Basename 10/23/11 0553 10/22/11 1124  HGB 10.7* 12.1  HCT 32.8* 37.2    Assessment/Plan: Status post Cesarean section. Postoperative course complicated by [facility]  Discharge home with standard precautions and return to clinic in 2 days for bp check and staple removal PIH labs prior to discharge.  Kimberly [name] G 10/24/2011, 8:30 AM

## 2011-10-24 NOTE — Clinical Social Work Maternal (Signed)
Clinical Social Work Department PSYCHOSOCIAL ASSESSMENT - MATERNAL/CHILD 10/24/2011  Patient:  Kimberly [name]  Account Number:  [id number]  Admit Date:  10/22/2011  Childs Name:   Kimberly [name]    Clinical Social Worker:  Lisaann Atha, LCSW   Date/Time:  10/24/2011 11:15 AM  Date Referred:  10/24/2011   Referral source  Central Nursery     Referred reason  Depression/Anxiety   Other referral source:    I:  FAMILY / HOME ENVIRONMENT Child's legal guardian:  PARENT  Guardian - Name Guardian - Age Guardian - Address  Kimberly [name] 35 [address] [place] [address]  Kimberly [name]  [address] [place] [address]   Other household support members/support persons Name Relationship DOB  Kimberly [name] SISTER 7 yrs   Other support:   MOB has support from family and friends    II  PSYCHOSOCIAL DATA Information Source:  Patient Interview  Financial and Community Resources 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   Financial resources:  Private Insurance If Medicaid - County:    School / Grade:   Maternity Care Coordinator / Child Services Coordination / Early Interventions:  Cultural issues impacting care:   None identified    III  STRENGTHS Strengths  Adequate Resources  Home prepared for Child (including basic supplies)  Supportive family/friends   Strength comment:    IV  RISK FACTORS AND CURRENT PROBLEMS Current Problem:  None   Risk Factor & Current Problem Patient Issue Family Issue Risk Factor / Current Problem Comment   N N     V  SOCIAL WORK ASSESSMENT Sw intern and Mina Babula, LCSW visited with MOB based due to history of anxiety. MOB stated this pregnancy was difficult because she had issues with elevated blood pressure throughout pregnancy. she stated she felt fine but doctors report said otherwise. MOB stated she has a daughter at home who does not seem to be excited about the attention now needing to be  divided. MOB thinks she will adjust fine. MOB states she did not experience any anxiety during pregnancy and does not foresee any. MOB stated she was tired but has many supports that will be able to relieve her when needed. SW educated MOB on signs and symptoms to look for in post partum depression and she denies any experience after first pregnancy.  SW has no social concerns at this time.  Assessment and documentation by Kimberly [name], MSW Intern.  Signed off by Kimberly Didonato, LCSW.      VI SOCIAL WORK PLAN Social Work Plan  Patient/Family Education   Type of pt/family education:   provided MOB with printed material on post partum depression, also went over signs and symptoms to look for.   If child protective services report - county:   If child protective services report - date:   Information/referral to community resources comment:   No needs identified.   Other social work plan:       Clinical Social Work Department PSYCHOSOCIAL ASSESSMENT - MATERNAL/CHILD 10/24/2011  Patient:  Kimberly [name], Kimberly [name]  Account Number:  [id number]  Admit Date:  10/22/2011  Kimberly [name] Name:   Kimberly [name]    Clinical Social Worker:  Kimberly [name]   Date/Time:  10/24/2011 11:15 AM  Date Referred:  10/24/2011   Referral source  Central Nursery     Referred reason  Depression/Anxiety   Other referral source:    I:  FAMILY / HOME ENVIRONMENT Child's legal guardian:  PARENT  Guardian - Name Guardian - Age Guardian - Address  Kimberly [name] 35 [address] [name] [address]  Kimberly [name]  [address] [name] [address]   Other household support members/support persons Name Relationship DOB  Kimberly [name] SISTER 7 yrs   Other support:   MOB has support from family and friends    II  PSYCHOSOCIAL DATA Information Source:  Patient Interview  Insurance claims handler Resources Employment:   Chemical engineer resources:  Media planner If [employer] - [place]:    School / Grade:   Maternity Care Coordinator / Child Services Coordination / Early Interventions:  Cultural issues impacting care:   None identified    III  STRENGTHS Strengths  Adequate Resources  Home prepared for Child (including basic supplies)  Supportive family/friends   Strength comment:    IV  RISK FACTORS AND CURRENT PROBLEMS Current Problem:  None   Risk Factor & Current Problem Patient Issue Family Issue Risk Factor / Current Problem Comment   N N     V  SOCIAL WORK ASSESSMENT Sw intern and Kimberly Riding, LCSW visited with MOB based due to history of anxiety. MOB stated this pregnancy was difficult because she had issues with elevated blood pressure throughout pregnancy. she stated she felt fine but doctors report said otherwise. MOB stated she has a daughter at home who does not seem to be excited about the attention now needing  to be divided. MOB thinks she will adjust fine. MOB states she did not experience any anxiety during pregnancy and does not foresee any. MOB stated she was tired but has many supports that will be able to relieve her when needed. SW educated MOB on signs and symptoms to look for in post partum depression and she denies any experience after first pregnancy.  SW has no social concerns at this time.  Assessment and documentation by Kimberly [name], MSW Intern.  Signed off by Kimberly Riding, LCSW.      VI SOCIAL WORK PLAN Social Work Plan  Patient/Family Education   Type of pt/family education:   provided MOB with printed material on post partum depression, also went over signs and symptoms to look for.   If child protective services report - county:   If child protective services report - date:   Information/referral to community resources comment:   No needs identified.   Other social work plan:

## 2013-09-10 ENCOUNTER — Telehealth: Payer: Self-pay | Admitting: *Deleted

## 2013-09-10 NOTE — Telephone Encounter (Signed)
Error encounter. 

## 2013-09-10 NOTE — Telephone Encounter (Signed)
Patient calls c/o

## 2013-11-16 ENCOUNTER — Encounter (HOSPITAL_COMMUNITY): Payer: Self-pay | Admitting: *Deleted

## 2015-03-15 ENCOUNTER — Ambulatory Visit: Payer: Self-pay | Admitting: Physician Assistant

## 2015-03-15 ENCOUNTER — Encounter: Payer: Self-pay | Admitting: Physician Assistant

## 2015-03-15 VITALS — BP 130/90 | HR 88 | Temp 98.7°F

## 2015-03-15 DIAGNOSIS — Z9109 Other allergy status, other than to drugs and biological substances: Secondary | ICD-10-CM

## 2015-03-15 DIAGNOSIS — J302 Other seasonal allergic rhinitis: Secondary | ICD-10-CM

## 2015-03-15 DIAGNOSIS — J0100 Acute maxillary sinusitis, unspecified: Secondary | ICD-10-CM

## 2015-03-15 MED ORDER — MONTELUKAST SODIUM 10 MG PO TABS: 10.0000 mg | ORAL_TABLET | Freq: Every day | ORAL | Status: DC

## 2015-03-15 MED ORDER — AMOXICILLIN 875 MG PO TABS: 875.0000 mg | ORAL_TABLET | Freq: Two times a day (BID) | ORAL | Status: DC

## 2015-03-15 MED ORDER — FLUTICASONE PROPIONATE 50 MCG/ACT NA SUSP: 2.0000 | Freq: Every day | NASAL | Status: DC

## 2015-03-15 MED ORDER — PREDNISONE 20 MG PO TABS: 40.0000 mg | ORAL_TABLET | Freq: Every day | ORAL | Status: DC

## 2015-03-15 NOTE — Progress Notes (Signed)
S chronic allergies, now with acute sxs facial pain , pressure ,blowing green , ST ,  R>L  ears roaring ,stopped up and painful , achy all over  Denies gi or gu sxs  O/ alert pleasant  NAD VSS  Flu swab negative ENT nasal turbinates swollen , left with purulent discharge and bleeding , maxillary and frontal tenderness,Throat injected  Neck supple without nodes Heart rsr lungs clear A/ seasonal and envirionmental allergies Acute sinusitis P / amoxicillan 875 mg bid #20, singulair , flonase , pred pulse See orders.  Allergy tip sheet given .Supportive measures discussed. Follow up prn not improving    Nasal saline products bid and prn

## 2015-04-12 DIAGNOSIS — J3501 Chronic tonsillitis: Secondary | ICD-10-CM | POA: Diagnosis not present

## 2015-04-12 DIAGNOSIS — H9319 Tinnitus, unspecified ear: Secondary | ICD-10-CM | POA: Diagnosis not present

## 2015-04-12 DIAGNOSIS — J309 Allergic rhinitis, unspecified: Secondary | ICD-10-CM | POA: Diagnosis not present

## 2015-04-12 DIAGNOSIS — H6980 Other specified disorders of Eustachian tube, unspecified ear: Secondary | ICD-10-CM | POA: Diagnosis not present

## 2015-06-06 ENCOUNTER — Ambulatory Visit: Payer: Self-pay | Admitting: Physician Assistant

## 2015-06-06 ENCOUNTER — Encounter: Payer: Self-pay | Admitting: Physician Assistant

## 2015-06-06 VITALS — BP 140/98 | HR 88 | Temp 98.4°F

## 2015-06-06 DIAGNOSIS — Z9109 Other allergy status, other than to drugs and biological substances: Secondary | ICD-10-CM

## 2015-06-06 MED ORDER — PREDNISONE 10 MG PO TABS: 30.0000 mg | ORAL_TABLET | Freq: Every day | ORAL | Status: DC

## 2015-06-06 NOTE — Progress Notes (Signed)
S: c/o runny nose, congestion, some sinus pressure, ear pain and some fullness, sx for about a week, denies fever/chills/body aches, cough, cp/sob, or v/d, coughed up green mucus today  O: vitals wnl, nad, perrl eomi, conjunctiva wnl, tms dull, nasal mucosa swollen and boggy, throat wnl, neck supple no lymph, lungs c t a, cv rrr  A: acute seasonal allergies  P: saline nasal rinse, use flonase, add prednisone 30mg  qd x 3d

## 2015-06-08 ENCOUNTER — Telehealth: Payer: Self-pay | Admitting: Physician Assistant

## 2015-06-08 MED ORDER — AMOXICILLIN 875 MG PO TABS: 875.0000 mg | ORAL_TABLET | Freq: Two times a day (BID) | ORAL | Status: DC

## 2015-06-08 NOTE — Telephone Encounter (Signed)
Pt states mucus has become dark/green, called in antibiotic as pt was seen in clinic 2 days ago

## 2015-06-17 ENCOUNTER — Encounter: Payer: Self-pay | Admitting: Physician Assistant

## 2015-06-17 ENCOUNTER — Ambulatory Visit: Payer: Self-pay | Admitting: Physician Assistant

## 2015-06-17 VITALS — BP 130/90 | HR 78 | Temp 98.6°F

## 2015-06-17 DIAGNOSIS — R42 Dizziness and giddiness: Secondary | ICD-10-CM

## 2015-06-17 MED ORDER — SCOPOLAMINE 1 MG/3DAYS TD PT72: 1.0000 | MEDICATED_PATCH | TRANSDERMAL | Status: DC

## 2015-06-17 NOTE — Progress Notes (Signed)
S: states she feels a little dizzy, when goes to stand up or moves her head to fast, feels like she has "sea legs" , using antivert but it makes her really drowsy, ? If anything else to do, getting ready to ride to the beach for the weekend and is worried about motion sickness  O: vitals wnl, nad, perrl eomi, tms clear, nasal mucosa wnl, throat wnl, neck supple no lymph, lungs c t a, cv rrr  A: motion sickness; vertigo  P: scopaline transderm, try maneuvers to decrease vertigo, will refer to ENT if not better 5 days

## 2015-09-14 ENCOUNTER — Ambulatory Visit: Payer: Self-pay | Admitting: Physician Assistant

## 2015-09-14 ENCOUNTER — Encounter: Payer: Self-pay | Admitting: Physician Assistant

## 2015-09-14 VITALS — BP 140/92 | HR 84 | Temp 98.5°F

## 2015-09-14 DIAGNOSIS — I10 Essential (primary) hypertension: Secondary | ICD-10-CM

## 2015-09-14 MED ORDER — HYDROCHLOROTHIAZIDE 25 MG PO TABS
25.0000 mg | ORAL_TABLET | Freq: Every day | ORAL | 3 refills | Status: DC
Start: 2015-09-14 — End: 2017-06-04

## 2015-09-14 NOTE — Progress Notes (Signed)
S: c/o elevated bp, head was hurting yesterday and kept hearing a whooshing sound in her ear, no cp/sob, bp in clinic has been elevated on last 2 visits at 140/98, 130/90  O: vitals w elevated bp at 140/92, lungs c t a, cv rrr, no bruits noted  A: htn  P: hctz 25mg  qd, f/u with pcp for yearly physical and eval

## 2015-10-26 ENCOUNTER — Other Ambulatory Visit
Admission: RE | Admit: 2015-10-26 | Discharge: 2015-10-26 | Disposition: A | Discharge status: Home / Self Care | Payer: 59 | Source: Ambulatory Visit | Attending: Internal Medicine | Admitting: Internal Medicine

## 2015-10-26 ENCOUNTER — Ambulatory Visit (INDEPENDENT_AMBULATORY_CARE_PROVIDER_SITE_OTHER): Payer: 59 | Admitting: Internal Medicine

## 2015-10-26 ENCOUNTER — Encounter: Payer: Self-pay | Admitting: Internal Medicine

## 2015-10-26 VITALS — BP 130/92 | HR 94 | Ht 64.0 in | Wt 266.8 lb

## 2015-10-26 DIAGNOSIS — R002 Palpitations: Secondary | ICD-10-CM | POA: Insufficient documentation

## 2015-10-26 DIAGNOSIS — I10 Essential (primary) hypertension: Secondary | ICD-10-CM

## 2015-10-26 LAB — BASIC METABOLIC PANEL
ANION GAP: 8 (ref 5–15)
BUN: 12 mg/dL (ref 6–20)
CHLORIDE: 105 mmol/L (ref 101–111)
CO2: 23 mmol/L (ref 22–32)
Calcium: 9.2 mg/dL (ref 8.9–10.3)
Creatinine, Ser: 0.66 mg/dL (ref 0.44–1.00)
GFR calc Af Amer: >60 mL/min (ref >60)
Glucose, Bld: 95 mg/dL (ref 65–99)
POTASSIUM: 3.7 mmol/L (ref 3.5–5.1)
SODIUM: 136 mmol/L (ref 135–145)

## 2015-10-26 LAB — MAGNESIUM: MAGNESIUM: 1.9 mg/dL (ref 1.7–2.4)

## 2015-10-26 NOTE — Progress Notes (Signed)
New Outpatient Visit Date: 10/26/2015  Primary Care Provider: Tomi Bamberger, NP  Chief Complaint: palpitations  HPI:  [name] is a 40 y.o. year-old female with history of palpitations in the setting of hypokalemia, hypertension, anxiety, and depression, who presents for evaluation of palpitations. The patient was evaluated in our clinic for years ago by Dr. Myrtis Ser for palpitations while pregnant. At the time, she was found to have hypokalemia. After repletion of potassium, the palpitations ceased. She notes that for the last few years, she has continued to have "off and on" palpitations. She gets the sensation of brief flutters or an isolated fast beat in her chest. These have increased in frequency over the last few weeks and are now occurring on a daily basis. She has mild shortness of breath with the palpitations. 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 She denies lightheadedness and syncope as well as chest pain, orthopnea, PND, and edema. Workup in 2013 was notable for a transthoracic echocardiogram that was normal. She has not undergone additional cardiovascular testing since that time. She drinks 6 cups of coffee per day but has tried to cut this back significantly over the last few weeks since her palpitations worsened. Given her history of hypokalemia-associated palpitations, the patient took a dose of potassium last night and feels as though her palpitations today are somewhat better.  --------------------------------------------------------------------------------------------------  Cardiovascular History & Procedures: Cardiovascular Problems:  Palpitations  Risk Factors:  None  Cath/PCI:  None  CV Surgery:  None  EP Procedures and Devices:  None  Non-Invasive Evaluation(s):  TTE (08/03/11): Normal LV and RV size and function (LVEF 60%). No significant valvular abnormalities.  Recent CV Pertinent Labs: Lab Results    Component Value Date   K 3.6 10/24/2011   BUN 8 10/24/2011   CREATININE 0.62 10/24/2011    --------------------------------------------------------------------------------------------------  Past Medical History:  Diagnosis Date  . Abnormal Pap smear   . Anxiety    no meds during pregnancy  . Chlamydia   . Depression   . Gastroesophageal reflux in pregancy    ocasional -  . Hypertension    PIH - No meds  . Hypokalemia    Potassium 2.7 August 02, 2011  . Tachycardia     Past Surgical History:  Procedure Laterality Date  . CESAREAN SECTION  2005   x 1  . WISDOM TOOTH EXTRACTION      Outpatient Encounter Prescriptions as of 10/26/2015  Medication Sig  . escitalopram (LEXAPRO) 10 MG tablet   . hydrochlorothiazide (HYDRODIURIL) 25 MG tablet Take 1 tablet (25 mg total) by mouth daily. (Patient taking differently: Take 25 mg by mouth daily as needed. )  . ibuprofen (ADVIL,MOTRIN) 600 MG tablet Take 1 tablet (600 mg total) by mouth every 6 (six) hours.  . montelukast (SINGULAIR) 10 MG tablet Take 1 tablet (10 mg total) by mouth at bedtime. For allergies   No facility-administered encounter medications on file as of 10/26/2015.     Allergies: Review of patient's allergies indicates no known allergies.  Social History   Social History  . Marital status: Married    Spouse name: N/A  . Number of children: N/A  . Years of education: N/A   Occupational History  . Not on file.   Social History Main Topics  . Smoking status: Former Smoker    Packs/day: 0.25    Years: 1.50    Types: Cigarettes    Quit date:  1990  . Smokeless tobacco: Never Used  . Alcohol use No  . Drug use: No  . Sexual activity: Yes    Birth control/ protection: None     Comment: pregnant   Other Topics Concern  . Not on file   Social History Narrative  . No narrative on file    Family History  Problem Relation Age of Onset  . Hypertension Mother   . Other Neg Hx     Review of  Systems: A 12-system review of systems was performed and was negative except as noted in the HPI.  --------------------------------------------------------------------------------------------------  Physical Exam: BP (!) 130/92 (BP Location: Right Arm, Patient Position: Sitting, Cuff Size: Normal)   Pulse 94   Ht 5\' 4"  (1.626 m)   Wt 266 lb 12 oz (121 kg)   BMI 45.79 kg/m   General:  Obese woman seated in the exam room. She is accompanied by her husband. HEENT: No conjunctival pallor or scleral icterus.  Moist mucous membranes.  OP clear. Neck: Supple without lymphadenopathy, thyromegaly, JVD, or HJR.  No carotid bruit. Lungs: Normal work of breathing.  Clear to auscultation bilaterally without wheezes or crackles. Heart: Regular rate and rhythm without murmurs, rubs, or gallops.  Non-displaced PMI. Abd: Bowel sounds present.  Soft, NT/ND. unable to assess hepatosplenomegaly due to body habitus. Ext: No lower extremity edema.  Radial, PT, and DP pulses are 2+ bilaterally Skin: warm and dry without rash Neuro: CNIII-XII intact.  Strength and fine-touch sensation intact in upper and lower extremities bilaterally. Psych: Normal mood and affect. She becomes tearful during the interview when discussing her anxiety.  EKG:  Normal sinus rhythm without significant abnormalities.  Lab Results  Component Value Date   WBC 8.8 10/24/2011   HGB 10.5 (L) 10/24/2011   HCT 33.0 (L) 10/24/2011   MCV 82.7 10/24/2011   PLT 178 10/24/2011    Lab Results  Component Value Date   NA 139 10/24/2011   K 3.6 10/24/2011   CL 106 10/24/2011   CO2 20 10/24/2011   BUN 8 10/24/2011   CREATININE 0.62 10/24/2011   GLUCOSE 113 (H) 10/24/2011   ALT 11 10/24/2011   --------------------------------------------------------------------------------------------------  ASSESSMENT AND PLAN: 1. Palpitations Patient is a long history of intermittent palpitations that have worsened recently. She describes very  brief episodes with a skipped beat, most likely reflecting PACs or PVCs. She notes very mild shortness of breath with the palpitations but otherwise has no worrisome symptoms including chest pain or lightheadedness. We will check a basic metabolic panel and magnesium level today, given that the patient has had palpitations in the past with electrolyte abnormalities. We have also agreed to proceed with a 48 hour Holter monitor for further evaluation. Additional diagnostic and therapeutic interventions will be discussed after completion of these tests. In the meantime, I have recommended that the patient avoid caffeine.  2. Essential hypertension Blood pressure is upper normal to mildly elevated today. We will not make any medication changes today and have the patient continue to follow with her PCP for management of her hypertension.  Follow-up: Return to clinic in 6 weeks.  Yvonne Kendallhristopher Linnea Todisco, MD 10/26/2015 3:57 PM

## 2015-10-26 NOTE — Patient Instructions (Addendum)
Medication Instructions:  Your physician recommends that you continue on your current medications as directed. Please refer to the Current Medication list given to you today.   Labwork: BMET and magnesium  Testing/Procedures: Your physician has recommended that you wear a holter monitor. Holter monitors are medical devices that record the heart's electrical activity. Doctors most often use these monitors to diagnose arrhythmias. Arrhythmias are problems with the speed or rhythm of the heartbeat. The monitor is a small, portable device. You can wear one while you do your normal daily activities. This is usually used to diagnose what is causing palpitations/syncope (passing out).    Follow-Up: Your physician recommends that you schedule a follow-up appointment in: 6 weeks with Dr. Okey DupreEnd.    Any Other Special Instructions Will Be Listed Below (If Applicable).     If you need a refill on your cardiac medications before your next appointment, please call your pharmacy.   Holter Monitoring A Holter monitor is a small device that is used to detect abnormal heart rhythms. It clips to your clothing and is connected by wires to flat, sticky disks (electrodes) that attach to your chest. It is worn continuously for 24-48 hours. HOME CARE INSTRUCTIONS  Wear your Holter monitor at all times, even while exercising and sleeping, for as long as directed by your health care provider.  Make sure that the Holter monitor is safely clipped to your clothing or close to your body as recommended by your health care provider.  Do not get the monitor or wires wet.  Do not put body lotion or moisturizer on your chest.  Keep your skin clean.  Keep a diary of your daily activities, such as walking and doing chores. If you feel that your heartbeat is abnormal or that your heart is fluttering or skipping a beat:  Record what you are doing when it happens.  Record what time of day the symptoms  occur.  Return your Holter monitor as directed by your health care provider.  Keep all follow-up visits as directed by your health care provider. This is important. SEEK IMMEDIATE MEDICAL CARE IF:  You feel lightheaded or you faint.  You have trouble breathing.  You feel pain in your chest, upper arm, or jaw.  You feel sick to your stomach and your skin is pale, cool, or damp.  You heartbeat feels unusual or abnormal.   This information is not intended to replace advice given to you by your health care provider. Make sure you discuss any questions you have with your health care provider.   Document Released: 09/30/2003 Document Revised: 01/22/2014 Document Reviewed: 08/10/2013 Elsevier Interactive Patient Education [employer]2016 Elsevier Inc.

## 2015-10-27 ENCOUNTER — Other Ambulatory Visit: Payer: Self-pay

## 2015-10-27 DIAGNOSIS — E876 Hypokalemia: Secondary | ICD-10-CM

## 2015-10-27 DIAGNOSIS — E612 Magnesium deficiency: Secondary | ICD-10-CM

## 2015-10-27 MED ORDER — POTASSIUM CHLORIDE CRYS ER 20 MEQ PO TBCR: 20.0000 meq | EXTENDED_RELEASE_TABLET | Freq: Every day | ORAL | 5 refills | Status: DC

## 2015-10-27 MED ORDER — MAGNESIUM 200 MG PO TABS: 2.0000 | ORAL_TABLET | Freq: Every day | ORAL | 0 refills | Status: DC

## 2015-11-15 ENCOUNTER — Telehealth: Payer: Self-pay | Admitting: Internal Medicine

## 2015-11-15 NOTE — Telephone Encounter (Signed)
S/w pt regarding f/[facility]. She reports feeling much better after taking potassium and magnesium and palpitations have improved and has elected to wait having 48 hour monitor placed. Pt will have repeat labs today or tomorrow.  She is appreciative of the reminder call w/no further questions at this time.

## 2015-11-22 ENCOUNTER — Other Ambulatory Visit
Admission: RE | Admit: 2015-11-22 | Discharge: 2015-11-22 | Disposition: A | Discharge status: Home / Self Care | Payer: 59 | Source: Ambulatory Visit | Attending: Internal Medicine | Admitting: Internal Medicine

## 2015-11-22 DIAGNOSIS — E612 Magnesium deficiency: Secondary | ICD-10-CM | POA: Diagnosis not present

## 2015-11-22 DIAGNOSIS — E876 Hypokalemia: Secondary | ICD-10-CM | POA: Insufficient documentation

## 2015-11-22 LAB — POTASSIUM: POTASSIUM: 4.4 mmol/L (ref 3.5–5.1)

## 2015-11-22 LAB — MAGNESIUM: MAGNESIUM: 1.8 mg/dL (ref 1.7–2.4)

## 2015-12-01 ENCOUNTER — Telehealth: Payer: Self-pay | Admitting: Internal Medicine

## 2015-12-01 NOTE — Telephone Encounter (Signed)
No answer. Left message to call back to discuss. 

## 2015-12-01 NOTE — Telephone Encounter (Signed)
lmov for patient to call office .  She is following up next week on a holter monitor she cancelled bc "pt calling stating she is on some new meds we placed her on and she is doing much better not really wanting to do this at the moment "  Does she need to keep appt anyway or should we cancel until she has monitor placed?

## 2015-12-01 NOTE — Telephone Encounter (Signed)
Would be best for patient to reschedule holter and appt with Dr End sometime after to have results then. If patient refuses holter, then recommend keep appt as scheduled.

## 2015-12-05 ENCOUNTER — Telehealth: Payer: Self-pay | Admitting: Internal Medicine

## 2015-12-05 NOTE — Telephone Encounter (Signed)
Patient doesn't want to do the heart monitor because she is feeling better. Thanks!

## 2015-12-05 NOTE — Telephone Encounter (Signed)
Noted  

## 2015-12-07 ENCOUNTER — Ambulatory Visit: Payer: Self-pay | Admitting: Internal Medicine

## 2016-01-22 DIAGNOSIS — J020 Streptococcal pharyngitis: Secondary | ICD-10-CM | POA: Diagnosis not present

## 2016-01-22 DIAGNOSIS — Z20818 Contact with and (suspected) exposure to other bacterial communicable diseases: Secondary | ICD-10-CM | POA: Diagnosis not present

## 2016-01-24 ENCOUNTER — Ambulatory Visit: Payer: Self-pay | Admitting: Physician Assistant

## 2016-01-24 ENCOUNTER — Encounter: Payer: Self-pay | Admitting: Physician Assistant

## 2016-01-24 VITALS — BP 132/90 | HR 116 | Temp 98.5°F

## 2016-01-24 DIAGNOSIS — J0300 Acute streptococcal tonsillitis, unspecified: Secondary | ICD-10-CM

## 2016-01-24 MED ORDER — AMOXICILLIN-POT CLAVULANATE 875-125 MG PO TABS: 1.0000 | ORAL_TABLET | Freq: Two times a day (BID) | ORAL | 0 refills | Status: DC

## 2016-01-24 MED ORDER — DEXAMETHASONE SODIUM PHOSPHATE 10 MG/ML IJ SOLN
10.0000 mg | Freq: Once | INTRAMUSCULAR | Status: AC
  Administered 2016-01-24: 10 mg via INTRAMUSCULAR

## 2016-01-24 MED ORDER — METHYLPREDNISOLONE 4 MG PO TBPK: ORAL_TABLET | ORAL | 0 refills | Status: DC

## 2016-01-24 MED ORDER — FLUCONAZOLE 150 MG PO TABS: ORAL_TABLET | ORAL | 0 refills | Status: DC

## 2016-01-24 NOTE — Progress Notes (Signed)
S: [facility], dx with strep throat, still having fever, chills, hard to swallow due to large tonsils, also sitting up at night to breathe bc feels like tonsils are blocking her airway, no choking, no cp/sob, is on pen v k bid  O: vitals wnl, nad, tms clear, throat w kissing tonsils , + exudate, neck supple no lymph, lungs c t a, cv rrr Decadron 10 mg im given by rma  A: acute tonsillitis, kissing tonsils  P: decadron in clinic, stop pen vk, rx for augmentin 875 bid, medrol dose pack, diflucan if needed

## 2016-03-07 DIAGNOSIS — H5213 Myopia, bilateral: Secondary | ICD-10-CM | POA: Diagnosis not present

## 2016-05-10 ENCOUNTER — Encounter: Payer: Self-pay | Admitting: Physician Assistant

## 2016-05-10 ENCOUNTER — Ambulatory Visit: Payer: Self-pay | Admitting: Physician Assistant

## 2016-05-10 VITALS — BP 125/80 | HR 105 | Temp 98.5°F

## 2016-05-10 DIAGNOSIS — J039 Acute tonsillitis, unspecified: Secondary | ICD-10-CM

## 2016-05-10 MED ORDER — AMOXICILLIN-POT CLAVULANATE 875-125 MG PO TABS: 1.0000 | ORAL_TABLET | Freq: Two times a day (BID) | ORAL | 0 refills | Status: DC

## 2016-05-10 MED ORDER — PREDNISONE 10 MG PO TABS: 30.0000 mg | ORAL_TABLET | Freq: Every day | ORAL | 0 refills | Status: DC

## 2016-05-10 NOTE — Progress Notes (Signed)
S: c/o sore throat for 2 days, ?low grade fever, states her daughter had strep 2 weeks ago, no cough or congestion, no cp/sob  O: vitals wnl, nad, tms clear, throat red, swollen, +exudate on enlarged tonsils b/l, neck supple, tonsillar glands tender, lungs c t a, cv rrr  A: acute tonsillitis, most likely strep due to hx  P: pt deferred strep test, rx for augmentin, pred  qd x 3d

## 2016-05-16 ENCOUNTER — Ambulatory Visit: Payer: Self-pay | Admitting: Physician Assistant

## 2016-05-16 ENCOUNTER — Encounter: Payer: Self-pay | Admitting: Physician Assistant

## 2016-05-16 VITALS — BP 142/90 | HR 122 | Temp 99.1°F

## 2016-05-16 DIAGNOSIS — J020 Streptococcal pharyngitis: Secondary | ICD-10-CM

## 2016-05-16 LAB — POCT RAPID STREP A (OFFICE): RAPID STREP A SCREEN: POSITIVE — AB

## 2016-05-16 MED ORDER — DEXAMETHASONE SODIUM PHOSPHATE 10 MG/ML IJ SOLN
10.0000 mg | Freq: Once | INTRAMUSCULAR | Status: AC
  Administered 2016-05-16: 10 mg via INTRAMUSCULAR

## 2016-05-16 MED ORDER — FLUCONAZOLE 150 MG PO TABS: ORAL_TABLET | ORAL | 0 refills | Status: DC

## 2016-05-16 MED ORDER — AZITHROMYCIN 250 MG PO TABS: ORAL_TABLET | ORAL | 0 refills | Status: DC

## 2016-05-16 NOTE — Progress Notes (Signed)
S: treated for strep on 4/26; states still has sore throat, augmentin has upset her stomach, has 2 days left of medication, some fever, no chills, children all had strep at same time, this is the 3rd round of strep throat /tonsillitis this year, no cough or congestion, no cp/sob  O: vitals w low grade temp, elevated hr, tms clear, throat red tonsils swollen no exudate, neck supple no lymph, lungs c t a, cv rrr, q strep +  A: acute resistant strep throat  P: zpack, take probiotic with medication, decadron  im given in clinic, pt to call [facility] for eval

## 2016-05-24 ENCOUNTER — Telehealth: Payer: Self-pay | Admitting: Physician Assistant

## 2016-05-24 MED ORDER — FLUCONAZOLE 150 MG PO TABS: ORAL_TABLET | ORAL | 0 refills | Status: DC

## 2016-05-24 NOTE — Telephone Encounter (Signed)
Pt states she has yeast from antibiotic, sent rx for diflucan to [facility]

## 2016-06-25 ENCOUNTER — Ambulatory Visit: Payer: Self-pay | Admitting: Physician Assistant

## 2016-06-25 VITALS — BP 148/100 | HR 96 | Temp 98.0°F

## 2016-06-25 DIAGNOSIS — R350 Frequency of micturition: Secondary | ICD-10-CM

## 2016-06-25 LAB — POCT URINALYSIS DIPSTICK
BILIRUBIN UA: NEGATIVE
Blood, UA: NEGATIVE
Glucose, UA: NEGATIVE
KETONES UA: NEGATIVE
Leukocytes, UA: NEGATIVE
Nitrite, UA: NEGATIVE
PROTEIN UA: NEGATIVE
SPEC GRAV UA: 1.02 (ref 1.010–1.025)
Urobilinogen, UA: 0.2 E.U./dL
pH, UA: 7 (ref 5.0–8.0)

## 2016-06-25 NOTE — Progress Notes (Signed)
S: c/o low back and urinary freq, pain radiates around to inguinal areas, no fever/chills, no known injury, does have hx of ovarian cysts, no vag discharge  O: vitals wnl nad, lumbar spine is not tender, abd soft nontender, ua wnl  A: urinary freq, low back pain  P: f/u if worsening, see gyn if needed

## 2016-09-06 DIAGNOSIS — Z1231 Encounter for screening mammogram for malignant neoplasm of breast: Secondary | ICD-10-CM | POA: Diagnosis not present

## 2016-09-06 DIAGNOSIS — Z01419 Encounter for gynecological examination (general) (routine) without abnormal findings: Secondary | ICD-10-CM | POA: Diagnosis not present

## 2016-09-06 DIAGNOSIS — Z6841 Body Mass Index (BMI) 40.0 and over, adult: Secondary | ICD-10-CM | POA: Diagnosis not present

## 2016-09-06 LAB — HM PAP SMEAR: HM Pap smear: NEGATIVE

## 2016-09-06 LAB — HM MAMMOGRAPHY

## 2017-05-28 ENCOUNTER — Encounter: Payer: Self-pay | Admitting: Family Medicine

## 2017-05-28 ENCOUNTER — Ambulatory Visit (INDEPENDENT_AMBULATORY_CARE_PROVIDER_SITE_OTHER): Payer: Self-pay | Admitting: Family Medicine

## 2017-05-28 VITALS — BP 132/100 | HR 82 | Temp 98.1°F | Wt 278.0 lb

## 2017-05-28 DIAGNOSIS — H6591 Unspecified nonsuppurative otitis media, right ear: Secondary | ICD-10-CM

## 2017-05-28 DIAGNOSIS — J019 Acute sinusitis, unspecified: Secondary | ICD-10-CM

## 2017-05-28 MED ORDER — AZITHROMYCIN 250 MG PO TABS: ORAL_TABLET | ORAL | 0 refills | Status: DC

## 2017-05-28 MED ORDER — IPRATROPIUM BROMIDE 0.03 % NA SOLN: 2.0000 | Freq: Two times a day (BID) | NASAL | 0 refills | Status: DC

## 2017-05-28 MED ORDER — FLUCONAZOLE 150 MG PO TABS: 150.0000 mg | ORAL_TABLET | Freq: Once | ORAL | 0 refills | Status: AC

## 2017-05-28 NOTE — Patient Instructions (Signed)

## 2017-05-28 NOTE — Progress Notes (Signed)
Patient ID: Kimberly [name], female    DOB: September 19, 1975, 42 y.o.   MRN: [id number]  PCP: Tomi Bamberger, NP (Inactive)  Chief Complaint  Patient presents with  . choice-sinus issue    Subjective:  HPI Kimberly [name] is a 42 y.o. female presents for evaluation right ear pressure/pain and sinus issues. [name] reports current symptoms have been ongoing for over 1 week. Symptoms include headache, sensation of fluid draining in right ear, right ear pressure, nasal congestion, sore throat, facial pain (maxillary and frontal sinus region). Denies fever, chills, persistent cough, shortness of breath, or wheezing. Social History   Socioeconomic History  . Marital status: Married    Spouse name: Not on file  . Number of children: Not on file  . Years of education: Not on file  . Highest education level: Not on file  Occupational History  . Not on file  Social Needs  . Financial resource strain: Not on file  . Food insecurity:    Worry: Not on file    Inability: Not on file  . Transportation needs:    Medical: Not on file    Non-medical: Not on file  Tobacco Use  . Smoking status: Former Smoker    Packs/day: 0.25    Years: 1.50    Pack years: 0.37    Types: Cigarettes    Last attempt to quit: 1990    Years since quitting: 29.3  . Smokeless tobacco: Never Used  Substance and Sexual Activity  . Alcohol use: No    Alcohol/week: 0.0 oz  . Drug use: No  . Sexual activity: Yes    Birth control/protection: None  Lifestyle  . Physical activity:    Days per week: Not on file    Minutes per session: Not on file  . Stress: Not on file  Relationships  . Social connections:    Talks on phone: Not on file    Gets together: Not on file    Attends religious service: Not on file    Active member of club or organization: Not on file    Attends meetings of clubs or organizations: Not on file    Relationship status: Not on file  . Intimate partner violence:    Fear of current or ex  partner: Not on file    Emotionally abused: Not on file    Physically abused: Not on file    Forced sexual activity: Not on file  Other Topics Concern  . Not on file  Social History Narrative  . Not on file    Family History  Problem Relation Age of Onset  . Hypertension Mother   . Suicidality Father   . Heart disease Maternal Grandmother        valve replacement  . Hypertension Maternal Grandmother   . Other Neg Hx    Review of Systems Pertinent negatives listed in HPI   Patient Active Problem List   Diagnosis Date Noted  . Tachycardia   . Hypokalemia     No Known Allergies  Prior to Admission medications   Medication Sig Start Date End Date Taking? Authorizing Provider  cetirizine (ZYRTEC) 10 MG tablet Take 10 mg by mouth daily.   Yes [provider]  escitalopram (LEXAPRO) 10 MG tablet  01/26/15  Yes [provider]  ibuprofen (ADVIL,MOTRIN) 600 MG tablet Take 1 tablet (600 mg total) by mouth every 6 (six) hours. 10/24/11  Yes Julio Sicks, NP  azithromycin (ZITHROMAX Z-PAK) 250 MG tablet 2  pills today then 1 pill a day for 4 days Patient not taking: Reported on 06/25/2016 05/16/16   Faythe Ghee, PA-C  fluconazole (DIFLUCAN) 150 MG tablet Take one now and one in a week Patient not taking: Reported on 06/25/2016 05/24/16   Sherrie Mustache Roselyn Bering, PA-C  hydrochlorothiazide (HYDRODIURIL) 25 MG tablet Take 1 tablet (25 mg total) by mouth daily. Patient not taking: Reported on 05/28/2017 09/14/15   Faythe Ghee, PA-C  Magnesium 200 MG TABS Take 2 tablets (400 mg total) by mouth daily. Patient not taking: Reported on 05/28/2017 10/27/15   End, Cristal Deer, MD  montelukast (SINGULAIR) 10 MG tablet Take 1 tablet (10 mg total) by mouth at bedtime. For allergies Patient not taking: Reported on 05/16/2016 03/15/15   Francesco Sor, NP  potassium chloride SA (K-DUR,KLOR-CON) 20 MEQ tablet Take 1 tablet (20 mEq total) by mouth daily. 10/27/15   End, Cristal Deer, MD   tranexamic acid (LYSTEDA) 650 MG TABS tablet  05/02/17   [provider]    Past Medical, Surgical Family and Social History reviewed and updated.    Objective:   Today's Vitals   05/28/17 0857  BP: (!) 132/100  Pulse: 82  Temp: 98.1 F (36.7 C)  SpO2: 98%  Weight: 278 lb (126.1 kg)    Wt Readings from Last 3 Encounters:  05/28/17 278 lb (126.1 kg)  10/26/15 266 lb 12 oz (121 kg)  10/19/11 267 lb (121.1 kg)   Physical Exam  Constitutional: She is oriented to person, place, and time. She appears well-developed and well-nourished.  HENT:  Head: Normocephalic.  Right Ear: No swelling. A middle ear effusion is present.  Left Ear: External ear normal.  Nose: Mucosal edema and rhinorrhea present. Right sinus exhibits maxillary sinus tenderness and frontal sinus tenderness. Left sinus exhibits maxillary sinus tenderness and frontal sinus tenderness.  Mouth/Throat: Oropharynx is clear and moist.  Eyes: Pupils are equal, round, and reactive to light.  Cardiovascular: Normal rate, regular rhythm, normal heart sounds and intact distal pulses.  Pulmonary/Chest: Effort normal and breath sounds normal.  Lymphadenopathy:    She has no cervical adenopathy.  Neurological: She is alert and oriented to person, place, and time.  Skin: Skin is warm and dry.  Psychiatric: She has a normal mood and affect. Her behavior is normal. Judgment and thought content normal.    Assessment & Plan:  1. Acute sinusitis, recurrence not specified, unspecified location, uncomplicated. Will treat empirically with broad spectrum antibiotic.   2. Middle Ear effusion, nonsuppurative changes. Will treat conservatively for now. Continue daily Cetrizine 10 mg at bedtime. Start Atrovent nasal spray twice daily.  Meds ordered this encounter  Medications  . azithromycin (ZITHROMAX) 250 MG tablet    Sig: Take 2 tabs PO x 1 dose, then 1 tab PO QD x 4 days    Dispense:  6 tablet    Refill:  0  .  ipratropium (ATROVENT) 0.03 % nasal spray    Sig: Place 2 sprays into both nostrils 2 (two) times daily.    Dispense:  30 mL    Refill:  0  . fluconazole (DIFLUCAN) 150 MG tablet    Sig: Take 1 tablet (150 mg total) by mouth once for 1 dose.    Dispense:  1 tablet    Refill:  0    If symptoms worsen or do not improve, return for follow-up, follow-up with PCP, or at the emergency department if severity of symptoms warrant a higher level of  care.    Godfrey Pick. Tiburcio Pea, MSN, FNP-C [facility]  [address]  [place], [place] [address] [phone]

## 2017-05-30 ENCOUNTER — Telehealth: Payer: Self-pay | Admitting: Emergency Medicine

## 2017-05-30 NOTE — Telephone Encounter (Signed)
Left message follow up call for visit with [facility] 

## 2017-06-04 ENCOUNTER — Ambulatory Visit: Payer: 59 | Admitting: Internal Medicine

## 2017-06-04 ENCOUNTER — Ambulatory Visit: Payer: Self-pay | Admitting: *Deleted

## 2017-06-04 ENCOUNTER — Encounter: Payer: Self-pay | Admitting: Internal Medicine

## 2017-06-04 VITALS — BP 142/80 | HR 90 | Temp 98.9°F | Ht 64.0 in | Wt 278.0 lb

## 2017-06-04 DIAGNOSIS — Z13220 Encounter for screening for lipoid disorders: Secondary | ICD-10-CM | POA: Diagnosis not present

## 2017-06-04 DIAGNOSIS — R41840 Attention and concentration deficit: Secondary | ICD-10-CM

## 2017-06-04 DIAGNOSIS — E559 Vitamin D deficiency, unspecified: Secondary | ICD-10-CM

## 2017-06-04 DIAGNOSIS — Z6841 Body Mass Index (BMI) 40.0 and over, adult: Secondary | ICD-10-CM | POA: Insufficient documentation

## 2017-06-04 DIAGNOSIS — I10 Essential (primary) hypertension: Secondary | ICD-10-CM | POA: Diagnosis not present

## 2017-06-04 DIAGNOSIS — R739 Hyperglycemia, unspecified: Secondary | ICD-10-CM | POA: Diagnosis not present

## 2017-06-04 DIAGNOSIS — R5383 Other fatigue: Secondary | ICD-10-CM | POA: Diagnosis not present

## 2017-06-04 DIAGNOSIS — G4700 Insomnia, unspecified: Secondary | ICD-10-CM | POA: Diagnosis not present

## 2017-06-04 DIAGNOSIS — Z1329 Encounter for screening for other suspected endocrine disorder: Secondary | ICD-10-CM | POA: Diagnosis not present

## 2017-06-04 DIAGNOSIS — R7303 Prediabetes: Secondary | ICD-10-CM

## 2017-06-04 DIAGNOSIS — D649 Anemia, unspecified: Secondary | ICD-10-CM | POA: Diagnosis not present

## 2017-06-04 MED ORDER — AMLODIPINE BESYLATE 2.5 MG PO TABS: 2.5000 mg | ORAL_TABLET | Freq: Every day | ORAL | 0 refills | Status: DC

## 2017-06-04 NOTE — Telephone Encounter (Signed)
  Answer Assessment - Initial Assessment Questions 1. DESCRIPTION: "Describe your dizziness."     Foggy headed, hard to focus during the day. 2. LIGHTHEADED: "Do you feel lightheaded?" (e.g., somewhat faint, woozy, weak upon standing)    None of these 3. VERTIGO: "Do you feel like either you or the room is spinning or tilting?" (i.e. vertigo)     no 4. SEVERITY: "How bad is it?"  "Do you feel like you are going to faint?" "Can you stand and walk?"   - MILD - walking normally   - MODERATE - interferes with normal activities (e.g., work, school)    - SEVERE - unable to stand, requires support to walk, feels like passing out now.      mild 5. ONSET:  "When did the dizziness begin?"    About 3 weeks ago.  6. AGGRAVATING FACTORS: "Does anything make it worse?" (e.g., standing, change in head position)     no 7. HEART RATE: "Can you tell me your heart rate?" "How many beats in 15 seconds?"  (Note: not all patients can do this)       89 bpm 8. CAUSE: "What do you think is causing the dizziness?"     She thinks her constant fatigue is causing the fogginess.  9. RECURRENT SYMPTOM: "Have you had dizziness before?" If so, ask: "When was the last time?" "What happened that time?"     Has felt like this in the past and saw a holistic Dr. Nikki Dom. OTHER SYMPTOMS: "Do you have any other symptoms?" (e.g., fever, chest pain, vomiting, diarrhea, bleeding)       She used albuterol inhaler this morning because she felt like she could not get a good deep breath in.  11. PREGNANCY: "Is there any chance you are pregnant?" "When was your last menstrual period?"       no  Protocols used: DIZZINESS [facility]

## 2017-06-04 NOTE — Progress Notes (Signed)
Pre visit review using our clinic review tool, if applicable. No additional management support is needed unless otherwise documented below in the visit note. 

## 2017-06-04 NOTE — Telephone Encounter (Signed)
FYI

## 2017-06-04 NOTE — Patient Instructions (Addendum)
Lexapro make sure taking 20 mg max!!!! Ask your husband if you stop breath while asleep   Amlodipine tablets What is this medicine? AMLODIPINE (am LOE di peen) is a calcium-channel blocker. It affects the amount of calcium found in your heart and muscle cells. This relaxes your blood vessels, which can reduce the amount of work the heart has to do. This medicine is used to lower high blood pressure. It is also used to prevent chest pain. This medicine may be used for other purposes; ask your health care provider or pharmacist if you have questions. COMMON BRAND NAME(S): Norvasc What should I tell my health care provider before I take this medicine? They need to know if you have any of these conditions: -heart problems like heart failure or aortic stenosis -liver disease -an unusual or allergic reaction to amlodipine, other medicines, foods, dyes, or preservatives -pregnant or trying to get pregnant -breast-feeding How should I use this medicine? Take this medicine by mouth with a glass of water. Follow the directions on the prescription label. Take your medicine at regular intervals. Do not take more medicine than directed. Talk to your pediatrician regarding the use of this medicine in children. Special care may be needed. This medicine has been used in children as young as 6. Persons over 56 years old may have a stronger reaction to this medicine and need smaller doses. Overdosage: If you think you have taken too much of this medicine contact a poison control center or emergency room at once. NOTE: This medicine is only for you. Do not share this medicine with others. What if I miss a dose? If you miss a dose, take it as soon as you can. If it is almost time for your next dose, take only that dose. Do not take double or extra doses. What may interact with this medicine? -herbal or dietary supplements -local or general anesthetics -medicines for high blood pressure -medicines for prostate  problems -rifampin This list may not describe all possible interactions. Give your health care provider a list of all the medicines, herbs, non-prescription drugs, or dietary supplements you use. Also tell them if you smoke, drink alcohol, or use illegal drugs. Some items may interact with your medicine. What should I watch for while using this medicine? Visit your doctor or health care professional for regular check ups. Check your blood pressure and pulse rate regularly. Ask your health care professional what your blood pressure and pulse rate should be, and when you should contact him or her. This medicine may make you feel confused, dizzy or lightheaded. Do not drive, use machinery, or do anything that needs mental alertness until you know how this medicine affects you. To reduce the risk of dizzy or fainting spells, do not sit or stand up quickly, especially if you are an older patient. Avoid alcoholic drinks; they can make you more dizzy. Do not suddenly stop taking amlodipine. Ask your doctor or health care professional how you can gradually reduce the dose. What side effects may I notice from receiving this medicine? Side effects that you should report to your doctor or health care professional as soon as possible: -allergic reactions like skin rash, itching or hives, swelling of the face, lips, or tongue -breathing problems -changes in vision or hearing -chest pain -fast, irregular heartbeat -swelling of legs or ankles Side effects that usually do not require medical attention (report to your doctor or health care professional if they continue or are bothersome): -dry mouth -facial  flushing -nausea, vomiting -stomach gas, pain -tired, weak -trouble sleeping This list may not describe all possible side effects. Call your doctor for medical advice about side effects. You may report side effects to FDA at 1-800-FDA-1088. Where should I keep my medicine? Keep out of the reach of  children. Store at room temperature between 59 and 86 degrees F (15 and 30 degrees C). Protect from light. Keep container tightly closed. Throw away any unused medicine after the expiration date. NOTE: This sheet is a summary. It may not cover all possible information. If you have questions about this medicine, talk to your doctor, pharmacist, or health care provider.  2018 Elsevier/Gold Standard (2011-11-30 11:40:58)   DASH Eating Plan DASH stands for "Dietary Approaches to Stop Hypertension." The DASH eating plan is a healthy eating plan that has been shown to reduce high blood pressure (hypertension). It may also reduce your risk for type 2 diabetes, heart disease, and stroke. The DASH eating plan may also help with weight loss. What are tips for following this plan? General guidelines  Avoid eating more than 2,300 mg (milligrams) of salt (sodium) a day. If you have hypertension, you may need to reduce your sodium intake to 1,500 mg a day.  Limit alcohol intake to no more than 1 drink a day for nonpregnant women and 2 drinks a day for men. One drink equals 12 oz of beer, 5 oz of wine, or 1 oz of hard liquor.  Work with your health care provider to maintain a healthy body weight or to lose weight. Ask what an ideal weight is for you.  Get at least 30 minutes of exercise that causes your heart to beat faster (aerobic exercise) most days of the week. Activities may include walking, swimming, or biking.  Work with your health care provider or diet and nutrition specialist (dietitian) to adjust your eating plan to your individual calorie needs. Reading food labels  Check food labels for the amount of sodium per serving. Choose foods with less than 5 percent of the Daily Value of sodium. Generally, foods with less than 300 mg of sodium per serving fit into this eating plan.  To find whole grains, look for the word "whole" as the first word in the ingredient list. Shopping  Buy products  labeled as "low-sodium" or "no salt added."  Buy fresh foods. Avoid canned foods and premade or frozen meals. Cooking  Avoid adding salt when cooking. Use salt-free seasonings or herbs instead of table salt or sea salt. Check with your health care provider or pharmacist before using salt substitutes.  Do not fry foods. Cook foods using healthy methods such as baking, boiling, grilling, and broiling instead.  Cook with heart-healthy oils, such as olive, canola, soybean, or sunflower oil. Meal planning   Eat a balanced diet that includes: ? 5 or more servings of fruits and vegetables each day. At each meal, try to fill half of your plate with fruits and vegetables. ? Up to 6-8 servings of whole grains each day. ? Less than 6 oz of lean meat, poultry, or fish each day. A 3-oz serving of meat is about the same size as a deck of cards. One egg equals 1 oz. ? 2 servings of low-fat dairy each day. ? A serving of nuts, seeds, or beans 5 times each week. ? Heart-healthy fats. Healthy fats called Omega-3 fatty acids are found in foods such as flaxseeds and coldwater fish, like sardines, salmon, and mackerel.  Limit how  much you eat of the following: ? Canned or prepackaged foods. ? Food that is high in trans fat, such as fried foods. ? Food that is high in saturated fat, such as fatty meat. ? Sweets, desserts, sugary drinks, and other foods with added sugar. ? Full-fat dairy products.  Do not salt foods before eating.  Try to eat at least 2 vegetarian meals each week.  Eat more home-cooked food and less restaurant, buffet, and fast food.  When eating at a restaurant, ask that your food be prepared with less salt or no salt, if possible. What foods are recommended? The items listed may not be a complete list. Talk with your dietitian about what dietary choices are best for you. Grains Whole-grain or whole-wheat bread. Whole-grain or whole-wheat pasta. Brown rice. Orpah Cobb. Bulgur.  Whole-grain and low-sodium cereals. Pita bread. Low-fat, low-sodium crackers. Whole-wheat flour tortillas. Vegetables Fresh or frozen vegetables (raw, steamed, roasted, or grilled). Low-sodium or reduced-sodium tomato and vegetable juice. Low-sodium or reduced-sodium tomato sauce and tomato paste. Low-sodium or reduced-sodium canned vegetables. Fruits All fresh, dried, or frozen fruit. Canned fruit in natural juice (without added sugar). Meat and other protein foods Skinless chicken or [place]. Ground chicken or [place]. Pork with fat trimmed off. Fish and seafood. Egg whites. Dried beans, peas, or lentils. Unsalted nuts, nut butters, and seeds. Unsalted canned beans. Lean cuts of beef with fat trimmed off. Low-sodium, lean deli meat. Dairy Low-fat (1%) or fat-free (skim) milk. Fat-free, low-fat, or reduced-fat cheeses. Nonfat, low-sodium ricotta or cottage cheese. Low-fat or nonfat yogurt. Low-fat, low-sodium cheese. Fats and oils Soft margarine without trans fats. Vegetable oil. Low-fat, reduced-fat, or light mayonnaise and salad dressings (reduced-sodium). Canola, safflower, olive, soybean, and sunflower oils. Avocado. Seasoning and other foods Herbs. Spices. Seasoning mixes without salt. Unsalted popcorn and pretzels. Fat-free sweets. What foods are not recommended? The items listed may not be a complete list. Talk with your dietitian about what dietary choices are best for you. Grains Baked goods made with fat, such as croissants, muffins, or some breads. Dry pasta or rice meal packs. Vegetables Creamed or fried vegetables. Vegetables in a cheese sauce. Regular canned vegetables (not low-sodium or reduced-sodium). Regular canned tomato sauce and paste (not low-sodium or reduced-sodium). Regular tomato and vegetable juice (not low-sodium or reduced-sodium). Rosita Fire. Olives. Fruits Canned fruit in a light or heavy syrup. Fried fruit. Fruit in cream or butter sauce. Meat and other protein  foods Fatty cuts of meat. Ribs. Fried meat. Tomasa Blase. Sausage. Bologna and other processed lunch meats. Salami. Fatback. Hotdogs. Bratwurst. Salted nuts and seeds. Canned beans with added salt. Canned or smoked fish. Whole eggs or egg yolks. Chicken or [place] with skin. Dairy Whole or 2% milk, cream, and half-and-half. Whole or full-fat cream cheese. Whole-fat or sweetened yogurt. Full-fat cheese. Nondairy creamers. Whipped toppings. Processed cheese and cheese spreads. Fats and oils Butter. Stick margarine. Lard. Shortening. Ghee. Bacon fat. Tropical oils, such as coconut, palm kernel, or palm oil. Seasoning and other foods Salted popcorn and pretzels. Onion salt, garlic salt, seasoned salt, table salt, and sea salt. Worcestershire sauce. Tartar sauce. Barbecue sauce. Teriyaki sauce. Soy sauce, including reduced-sodium. Steak sauce. Canned and packaged gravies. Fish sauce. Oyster sauce. Cocktail sauce. Horseradish that you find on the shelf. Ketchup. Mustard. Meat flavorings and tenderizers. Bouillon cubes. Hot sauce and Tabasco sauce. Premade or packaged marinades. Premade or packaged taco seasonings. Relishes. Regular salad dressings. Where to find more information:  National Heart, Lung, and Blood Institute: PopSteam.is  American Heart Association: www.heart.org Summary  The DASH eating plan is a healthy eating plan that has been shown to reduce high blood pressure (hypertension). It may also reduce your risk for type 2 diabetes, heart disease, and stroke.  With the DASH eating plan, you should limit salt (sodium) intake to 2,300 mg a day. If you have hypertension, you may need to reduce your sodium intake to 1,500 mg a day.  When on the DASH eating plan, aim to eat more fresh fruits and vegetables, whole grains, lean proteins, low-fat dairy, and heart-healthy fats.  Work with your health care provider or diet and nutrition specialist (dietitian) to adjust your eating plan to your  individual calorie needs. This information is not intended to replace advice given to you by your health care provider. Make sure you discuss any questions you have with your health care provider. Document Released: 12/21/2010 Document Revised: 12/26/2015 Document Reviewed: 12/26/2015 Elsevier Interactive Patient Education  2018 [employer].  Hypertension Hypertension, commonly called high blood pressure, is when the force of blood pumping through the arteries is too strong. The arteries are the blood vessels that carry blood from the heart throughout the body. Hypertension forces the heart to work harder to pump blood and may cause arteries to become narrow or stiff. Having untreated or uncontrolled hypertension can cause heart attacks, strokes, kidney disease, and other problems. A blood pressure reading consists of a higher number over a lower number. Ideally, your blood pressure should be below 120/80. The first ("top") number is called the systolic pressure. It is a measure of the pressure in your arteries as your heart beats. The second ("bottom") number is called the diastolic pressure. It is a measure of the pressure in your arteries as the heart relaxes. What are the causes? The cause of this condition is not known. What increases the risk? Some risk factors for high blood pressure are under your control. Others are not. Factors you can change  Smoking.  Having type 2 diabetes mellitus, high cholesterol, or both.  Not getting enough exercise or physical activity.  Being overweight.  Having too much fat, sugar, calories, or salt (sodium) in your diet.  Drinking too much alcohol. Factors that are difficult or impossible to change  Having chronic kidney disease.  Having a family history of high blood pressure.  Age. Risk increases with age.  Race. You may be at higher risk if you are African-American.  Gender. Men are at higher risk than women before age 52. After age 69,  women are at higher risk than men.  Having obstructive sleep apnea.  Stress. What are the signs or symptoms? Extremely high blood pressure (hypertensive crisis) may cause:  Headache.  Anxiety.  Shortness of breath.  Nosebleed.  Nausea and vomiting.  Severe chest pain.  Jerky movements you cannot control (seizures).  How is this diagnosed? This condition is diagnosed by measuring your blood pressure while you are seated, with your arm resting on a surface. The cuff of the blood pressure monitor will be placed directly against the skin of your upper arm at the level of your heart. It should be measured at least twice using the same arm. Certain conditions can cause a difference in blood pressure between your right and left arms. Certain factors can cause blood pressure readings to be lower or higher than normal (elevated) for a short period of time:  When your blood pressure is higher when you are in a health care provider's office  than when you are at home, this is called white coat hypertension. Most people with this condition do not need medicines.  When your blood pressure is higher at home than when you are in a health care provider's office, this is called masked hypertension. Most people with this condition may need medicines to control blood pressure.  If you have a high blood pressure reading during one visit or you have normal blood pressure with other risk factors:  You may be asked to return on a different day to have your blood pressure checked again.  You may be asked to monitor your blood pressure at home for 1 week or longer.  If you are diagnosed with hypertension, you may have other blood or imaging tests to help your health care provider understand your overall risk for other conditions. How is this treated? This condition is treated by making healthy lifestyle changes, such as eating healthy foods, exercising more, and reducing your alcohol intake. Your health  care provider may prescribe medicine if lifestyle changes are not enough to get your blood pressure under control, and if:  Your systolic blood pressure is above 130.  Your diastolic blood pressure is above 80.  Your personal target blood pressure may vary depending on your medical conditions, your age, and other factors. Follow these instructions at home: Eating and drinking  Eat a diet that is high in fiber and potassium, and low in sodium, added sugar, and fat. An example eating plan is called the DASH (Dietary Approaches to Stop Hypertension) diet. To eat this way: ? Eat plenty of fresh fruits and vegetables. Try to fill half of your plate at each meal with fruits and vegetables. ? Eat whole grains, such as whole wheat pasta, brown rice, or whole grain bread. Fill about one quarter of your plate with whole grains. ? Eat or drink low-fat dairy products, such as skim milk or low-fat yogurt. ? Avoid fatty cuts of meat, processed or cured meats, and poultry with skin. Fill about one quarter of your plate with lean proteins, such as fish, chicken without skin, beans, eggs, and tofu. ? Avoid premade and processed foods. These tend to be higher in sodium, added sugar, and fat.  Reduce your daily sodium intake. Most people with hypertension should eat less than 1,500 mg of sodium a day.  Limit alcohol intake to no more than 1 drink a day for nonpregnant women and 2 drinks a day for men. One drink equals 12 oz of beer, 5 oz of wine, or 1 oz of hard liquor. Lifestyle  Work with your health care provider to maintain a healthy body weight or to lose weight. Ask what an ideal weight is for you.  Get at least 30 minutes of exercise that causes your heart to beat faster (aerobic exercise) most days of the week. Activities may include walking, swimming, or biking.  Include exercise to strengthen your muscles (resistance exercise), such as pilates or lifting weights, as part of your weekly exercise  routine. Try to do these types of exercises for 30 minutes at least 3 days a week.  Do not use any products that contain nicotine or tobacco, such as cigarettes and e-cigarettes. If you need help quitting, ask your health care provider.  Monitor your blood pressure at home as told by your health care provider.  Keep all follow-up visits as told by your health care provider. This is important. Medicines  Take over-the-counter and prescription medicines only as told by  your health care provider. Follow directions carefully. Blood pressure medicines must be taken as prescribed.  Do not skip doses of blood pressure medicine. Doing this puts you at risk for problems and can make the medicine less effective.  Ask your health care provider about side effects or reactions to medicines that you should watch for. Contact a health care provider if:  You think you are having a reaction to a medicine you are taking.  You have headaches that keep coming back (recurring).  You feel dizzy.  You have swelling in your ankles.  You have trouble with your vision. Get help right away if:  You develop a severe headache or confusion.  You have unusual weakness or numbness.  You feel faint.  You have severe pain in your chest or abdomen.  You vomit repeatedly.  You have trouble breathing. Summary  Hypertension is when the force of blood pumping through your arteries is too strong. If this condition is not controlled, it may put you at risk for serious complications.  Your personal target blood pressure may vary depending on your medical conditions, your age, and other factors. For most people, a normal blood pressure is less than 120/80.  Hypertension is treated with lifestyle changes, medicines, or a combination of both. Lifestyle changes include weight loss, eating a healthy, low-sodium diet, exercising more, and limiting alcohol. This information is not intended to replace advice given to you  by your health care provider. Make sure you discuss any questions you have with your health care provider. Document Released: 01/01/2005 Document Revised: 11/30/2015 Document Reviewed: 11/30/2015 Elsevier Interactive Patient Education  2018 [employer].  Insomnia Insomnia is a sleep disorder that makes it difficult to fall asleep or to stay asleep. Insomnia can cause tiredness (fatigue), low energy, difficulty concentrating, mood swings, and poor performance at work or school. There are three different ways to classify insomnia:  Difficulty falling asleep.  Difficulty staying asleep.  Waking up too early in the morning.  Any type of insomnia can be long-term (chronic) or short-term (acute). Both are common. Short-term insomnia usually lasts for three months or less. Chronic insomnia occurs at least three times a week for longer than three months. What are the causes? Insomnia may be caused by another condition, situation, or substance, such as:  Anxiety.  Certain medicines.  Gastroesophageal reflux disease (GERD) or other gastrointestinal conditions.  Asthma or other breathing conditions.  Restless legs syndrome, sleep apnea, or other sleep disorders.  Chronic pain.  Menopause. This may include hot flashes.  Stroke.  Abuse of alcohol, tobacco, or illegal drugs.  Depression.  Caffeine.  Neurological disorders, such as Alzheimer disease.  An overactive thyroid (hyperthyroidism).  The cause of insomnia may not be known. What increases the risk? Risk factors for insomnia include:  Gender. Women are more commonly affected than men.  Age. Insomnia is more common as you get older.  Stress. This may involve your professional or personal life.  Income. Insomnia is more common in people with lower income.  Lack of exercise.  Irregular work schedule or night shifts.  Traveling between different time zones.  What are the signs or symptoms? If you have insomnia,  trouble falling asleep or trouble staying asleep is the main symptom. This may lead to other symptoms, such as:  Feeling fatigued.  Feeling nervous about going to sleep.  Not feeling rested in the morning.  Having trouble concentrating.  Feeling irritable, anxious, or depressed.  How is this treated?  Treatment for insomnia depends on the cause. If your insomnia is caused by an underlying condition, treatment will focus on addressing the condition. Treatment may also include:  Medicines to help you sleep.  Counseling or therapy.  Lifestyle adjustments.  Follow these instructions at home:  Take medicines only as directed by your health care provider.  Keep regular sleeping and waking hours. Avoid naps.  Keep a sleep diary to help you and your health care provider figure out what could be causing your insomnia. Include: ? When you sleep. ? When you wake up during the night. ? How well you sleep. ? How rested you feel the next day. ? Any side effects of medicines you are taking. ? What you eat and drink.  Make your bedroom a comfortable place where it is easy to fall asleep: ? Put up shades or special blackout curtains to block light from outside. ? Use a white noise machine to block noise. ? Keep the temperature cool.  Exercise regularly as directed by your health care provider. Avoid exercising right before bedtime.  Use relaxation techniques to manage stress. Ask your health care provider to suggest some techniques that may work well for you. These may include: ? Breathing exercises. ? Routines to release muscle tension. ? Visualizing peaceful scenes.  Cut back on alcohol, caffeinated beverages, and cigarettes, especially close to bedtime. These can disrupt your sleep.  Do not overeat or eat spicy foods right before bedtime. This can lead to digestive discomfort that can make it hard for you to sleep.  Limit screen use before bedtime. This includes: ? Watching  TV. ? Using your smartphone, tablet, and computer.  Stick to a routine. This can help you fall asleep faster. Try to do a quiet activity, brush your teeth, and go to bed at the same time each night.  Get out of bed if you are still awake after 15 minutes of trying to sleep. Keep the lights down, but try reading or doing a quiet activity. When you feel sleepy, go back to bed.  Make sure that you drive carefully. Avoid driving if you feel very sleepy.  Keep all follow-up appointments as directed by your health care provider. This is important. Contact a health care provider if:  You are tired throughout the day or have trouble in your daily routine due to sleepiness.  You continue to have sleep problems or your sleep problems get worse. Get help right away if:  You have serious thoughts about hurting yourself or someone else. This information is not intended to replace advice given to you by your health care provider. Make sure you discuss any questions you have with your health care provider. Document Released: 12/30/1999 Document Revised: 06/03/2015 Document Reviewed: 10/02/2013 Elsevier Interactive Patient Education  [employer].

## 2017-06-04 NOTE — Progress Notes (Signed)
Chief Complaint  Patient presents with  . New Patient (Initial Visit)   New patient  1. History of anxiety on Lexapro she reported 40 mg qd but this is too much disc with pt today she thinks dose may be 10 mg and she is taking 2 pills Dr. Corinna Capra [facility] was Rx medication which is helping  2. C/o palpitations in the past has seen [facility] she reports palpitations were 2/2 to her K being low in the past  3. HTN slightly elevated today not on meds per review of chart BP has been intermittently elevated x years. She had preeclampsia with pregnancies and BP was elevated  4. She c/o fatigue and "fogginess" at times she is only sleeping 5-6 . Brain fog has been x 1 month and she reports lack of energy and concentration during the day but about 6 pm she will be clear and then she cant fall asleep until midnight/12:30 am. She has tried melatonin for sleep. She reports trouble reading and at work she has to read something several times this has happened now and as a kid she thinks. She does have anxiety but denies depression. She does report she snores at night but unclear if she stops breathing.   5. H/o anemia she reports she is craving  Ice   Review of Systems  Constitutional: Positive for malaise/fatigue. Negative for weight loss.  HENT: Negative for hearing loss.   Eyes: Negative for blurred vision.  Respiratory: Negative for shortness of breath.   Cardiovascular: Negative for chest pain.  Gastrointestinal: Negative for abdominal pain.  Genitourinary: Negative for dysuria.  Musculoskeletal: Negative for falls.  Skin: Negative for rash.  Neurological: Negative for dizziness.       +brain fog per pt   Psychiatric/Behavioral: Negative for depression. The patient is nervous/anxious and has insomnia.        +sleeping 5-6 hrs  +trouble with concentration    Past Medical History:  Diagnosis Date  . Abnormal Pap smear   . Allergy   . Anxiety    no meds during pregnancy  . Asthma   . Chicken pox    . Chlamydia   . Depression   . Gastroesophageal reflux in pregancy    ocasional -  . Heart murmur   . Hypertension    PIH - No meds  . Hypokalemia    Potassium 2.7 August 02, 2011  . Hypokalemia   . Preeclampsia   . Tachycardia    Past Surgical History:  Procedure Laterality Date  . CESAREAN SECTION  2005, 2013   x 2  . WISDOM TOOTH EXTRACTION     Family History  Problem Relation Age of Onset  . Hypertension Mother   . Suicidality Father   . Alcohol abuse Father   . Heart disease Maternal Grandmother        valve replacement  . Hypertension Maternal Grandmother   . Asthma Son   . Cancer Paternal Grandmother        breast   . Diabetes Paternal Grandmother   . Depression Paternal Grandfather   . Heart disease Paternal Grandfather   . Other Neg Hx    Social History   Socioeconomic History  . Marital status: Married    Spouse name: Not on file  . Number of children: Not on file  . Years of education: Not on file  . Highest education level: Not on file  Occupational History  . Not on file  Social Needs  .  Financial resource strain: Not on file  . Food insecurity:    Worry: Not on file    Inability: Not on file  . Transportation needs:    Medical: Not on file    Non-medical: Not on file  Tobacco Use  . Smoking status: Former Smoker    Packs/day: 0.25    Years: 1.50    Pack years: 0.37    Types: Cigarettes    Last attempt to quit: 1990    Years since quitting: 29.4  . Smokeless tobacco: Never Used  Substance and Sexual Activity  . Alcohol use: No    Alcohol/week: 0.0 oz  . Drug use: No  . Sexual activity: Yes    Birth control/protection: None  Lifestyle  . Physical activity:    Days per week: Not on file    Minutes per session: Not on file  . Stress: Not on file  Relationships  . Social connections:    Talks on phone: Not on file    Gets together: Not on file    Attends religious service: Not on file    Active member of club or organization: Not  on file    Attends meetings of clubs or organizations: Not on file    Relationship status: Not on file  . Intimate partner violence:    Fear of current or ex partner: Not on file    Emotionally abused: Not on file    Physically abused: Not on file    Forced sexual activity: Not on file  Other Topics Concern  . Not on file  Social History Narrative   Married    2 kids son and daughter    HS ed    Financial advocate    Smoked socially as teenager    No guns, wears seat belt, safe in relationship    Current Meds  Medication Sig  . albuterol (PROAIR HFA) 108 (90 Base) MCG/ACT inhaler Inhale into the lungs every 6 (six) hours as needed for wheezing or shortness of breath.  . cetirizine (ZYRTEC) 10 MG tablet Take 10 mg by mouth daily.  Marland Kitchen escitalopram (LEXAPRO) 10 MG tablet 20 mg.    No Known Allergies Recent Results (from the past 2160 hour(s))  Comprehensive metabolic panel     Status: Abnormal   Collection Time: 06/07/17  8:02 AM  Result Value Ref Range   Glucose, Bld 101 (H) 65 - 99 mg/dL    Comment: .            Fasting reference interval . For someone without known diabetes, a glucose value between 100 and 125 mg/dL is consistent with prediabetes and should be confirmed with a follow-up test. .    BUN 11 7 - 25 mg/dL   Creat 0.59 0.50 - 1.10 mg/dL   BUN/Creatinine Ratio NOT APPLICABLE 6 - 22 (calc)   Sodium 138 135 - 146 mmol/L   Potassium 4.5 3.5 - 5.3 mmol/L   Chloride 108 98 - 110 mmol/L   CO2 19 (L) 20 - 32 mmol/L   Calcium 8.6 8.6 - 10.2 mg/dL   Total Protein 6.7 6.1 - 8.1 g/dL   Albumin 3.7 3.6 - 5.1 g/dL   Globulin 3.0 1.9 - 3.7 g/dL (calc)   AG Ratio 1.2 1.0 - 2.5 (calc)   Total Bilirubin 0.3 0.2 - 1.2 mg/dL   Alkaline phosphatase (APISO) 59 33 - 115 U/L   AST 19 10 - 30 U/L   ALT 22 6 - 29 U/L  CBC with Differential/Platelet     Status: Abnormal   Collection Time: 06/07/17  8:02 AM  Result Value Ref Range   WBC 6.1 3.8 - 10.8 Thousand/uL   RBC 5.30  (H) 3.80 - 5.10 Million/uL   Hemoglobin 10.1 (L) 11.7 - 15.5 g/dL   HCT 34.4 (L) 35.0 - 45.0 %   MCV 64.9 (L) 80.0 - 100.0 fL   MCH 19.1 (L) 27.0 - 33.0 pg   MCHC 29.4 (L) 32.0 - 36.0 g/dL   RDW 18.2 (H) 11.0 - 15.0 %   Platelets 292 140 - 400 Thousand/uL   MPV 11.0 7.5 - 12.5 fL   Neutro Abs 3,367 1,500 - 7,800 cells/uL   Lymphs Abs 1,891 850 - 3,900 cells/uL   WBC mixed population 470 200 - 950 cells/uL   Eosinophils Absolute 323 15 - 500 cells/uL   Basophils Absolute 49 0 - 200 cells/uL   Neutrophils Relative % 55.2 %   Total Lymphocyte 31.0 %   Monocytes Relative 7.7 %   Eosinophils Relative 5.3 %   Basophils Relative 0.8 %   Smear Review      Comment: . Red cell morphology appears unremarkable . Review of peripheral smear confirms automated results.   Hemoglobin A1c     Status: Abnormal   Collection Time: 06/07/17  8:02 AM  Result Value Ref Range   Hgb A1c MFr Bld 5.9 (H) <5.7 % of total Hgb    Comment: For someone without known diabetes, a hemoglobin  A1c value between 5.7% and 6.4% is consistent with prediabetes and should be confirmed with a  follow-up test. . For someone with known diabetes, a value <7% indicates that their diabetes is well controlled. A1c targets should be individualized based on duration of diabetes, age, comorbid conditions, and other considerations. . This assay result is consistent with an increased risk of diabetes. . Currently, no consensus exists regarding use of hemoglobin A1c for diagnosis of diabetes for children. .    Mean Plasma Glucose 123 (calc)   eAG (mmol/L) 6.8 (calc)  Lipid panel     Status: Abnormal   Collection Time: 06/07/17  8:02 AM  Result Value Ref Range   Cholesterol 190 <200 mg/dL   HDL 46 (L) >50 mg/dL   Triglycerides 128 <150 mg/dL   LDL Cholesterol (Calc) 120 (H) mg/dL (calc)    Comment: Reference range: <100 . Desirable range <100 mg/dL for primary prevention;   <70 mg/dL for patients with CHD or  diabetic patients  with > or = 2 CHD risk factors. Marland Kitchen LDL-C is now calculated using the Martin-Hopkins  calculation, which is a validated novel method providing  better accuracy than the Friedewald equation in the  estimation of LDL-C.  Cresenciano Genre et al. Annamaria Helling. 7425;956(38): 2061-2068  (http://education.[facility].com/faq/FAQ164)    Total CHOL/HDL Ratio 4.1 <5.0 (calc)   Non-HDL Cholesterol (Calc) 144 (H) <130 mg/dL (calc)    Comment: For patients with diabetes plus 1 major ASCVD risk  factor, treating to a non-HDL-C goal of <100 mg/dL  (LDL-C of <70 mg/dL) is considered a therapeutic  option.   TSH     Status: None   Collection Time: 06/07/17  8:02 AM  Result Value Ref Range   TSH 1.32 mIU/L    Comment:           Reference Range .           > or = 20 Years  0.40-4.50 .  Pregnancy Ranges           First trimester    0.26-2.66           Second trimester   0.55-2.73           Third trimester    0.43-2.91   T4, free     Status: None   Collection Time: 06/07/17  8:02 AM  Result Value Ref Range   Free T4 1.1 0.8 - 1.8 ng/dL  VITAMIN D 25 Hydroxy (Vit-D Deficiency, Fractures)     Status: Abnormal   Collection Time: 06/07/17  8:02 AM  Result Value Ref Range   Vit D, 25-Hydroxy 20 (L) 30 - 100 ng/mL    Comment: Vitamin D Status         25-OH Vitamin D: . Deficiency:                    <20 ng/mL Insufficiency:             20 - 29 ng/mL Optimal:                 > or = 30 ng/mL . For 25-OH Vitamin D testing on patients on  D2-supplementation and patients for whom quantitation  of D2 and D3 fractions is required, the Quest[facility]ureD(TM) 25-OH VIT D, (D2,D3), LC/MS/MS is recommended: order  code 952-528-3549 (patients >34yr). . For more information on this test, go to: http://education.[facility].com/faq/FAQ163 (This link is being provided for  informational/educational purposes only.)   Iron, TIBC and Ferritin Panel     Status: Abnormal   Collection Time:  06/07/17  8:02 AM  Result Value Ref Range   Iron 20 (L) 40 - 190 mcg/dL   TIBC 423 250 - 450 mcg/dL (calc)   %SAT 5 (L) 11 - 50 % (calc)   Ferritin 6 (L) 10 - 232 ng/mL   Objective  Body mass index is 47.72 kg/m. Wt Readings from Last 3 Encounters:  06/04/17 278 lb (126.1 kg)  05/28/17 278 lb (126.1 kg)  10/26/15 266 lb 12 oz (121 kg)   Temp Readings from Last 3 Encounters:  06/04/17 98.9 F (37.2 C) (Oral)  05/28/17 98.1 F (36.7 C)  06/25/16 98 F (36.7 C)   BP Readings from Last 3 Encounters:  06/04/17 (!) 142/80  05/28/17 (!) 132/100  06/25/16 (!) 148/100   Pulse Readings from Last 3 Encounters:  06/04/17 90  05/28/17 82  06/25/16 96    Physical Exam  Constitutional: She is oriented to person, place, and time. She appears well-developed and well-nourished. She is cooperative.  HENT:  Head: Normocephalic and atraumatic.  Mouth/Throat: Oropharynx is clear and moist and mucous membranes are normal.  Eyes: Pupils are equal, round, and reactive to light. Conjunctivae are normal.  Cardiovascular: Normal rate, regular rhythm and normal heart sounds.  Pulmonary/Chest: Effort normal and breath sounds normal.  Neurological: She is alert and oriented to person, place, and time. Gait normal.  Skin: Skin is warm, dry and intact.  Psychiatric: She has a normal mood and affect. Her speech is normal and behavior is normal. Judgment and thought content normal. Cognition and memory are normal.  Nursing note and vitals reviewed.   Assessment   1. Anxiety controlled, trouble with concentration and insomnia  2. HTN  3. Fatigue  4. Obesity BMI >47  5. Iron def Anemia with PICA sx's  6. HM Plan   1. Cont lexapro disc max dose is 20 mg so pt will  make sure she is taking this dose  Refer psychological testing ADD/ADHD due to concentration issues  2. norvasc 2.5 mg qd start  rec healthy diet choices and weight loss  3.  Check labs today  rec increase sleep 7-8 hours  Ask  husband if has apnea with snoring if so could consider sleep study  4. rec healthy diet choices and exercise to lose 5. Check CBC and iron levels today  6.  Had flu shot 2018  ? Date Tdap thinks had 2015/2016  ? If had pna 23  Mammogram and pap 08/2016 Dr. [facility] will get records signed release today  Never had colonoscopy  Former smoker in HS She reports she has had hep B titer and MMR titer with work   [facility]  [facility]  [facility]  Provider: Dr. Olivia Mackie [facility]

## 2017-06-04 NOTE — Telephone Encounter (Signed)
Patient has a new patient appointment this afternoon. She calls today stating she has a feeling of lightheadedness, actually foggy headed she stated. She feels fatigued soon after waking up in the mornings. As her day goes on, the fogginess occurs and her focus decreases and begins to feel sleepy in the afternoons. She began feeling this way about 3 weeks ago. She experienced fatigue in the past and saw a holistic Dr. But no longer sees that Dr. Nicki Reaper she gets energetic in the evenings and as a result goes to sleep late. Was seen at [facility] last week with fluid behind the ear. Prescribed zpak which she completed 3 days ago.

## 2017-06-06 ENCOUNTER — Telehealth: Payer: Self-pay | Admitting: Radiology

## 2017-06-06 NOTE — Telephone Encounter (Signed)
Pt coming in for labs tomorrow, please place future orders. Thank you.  

## 2017-06-07 ENCOUNTER — Other Ambulatory Visit (INDEPENDENT_AMBULATORY_CARE_PROVIDER_SITE_OTHER): Payer: 59

## 2017-06-07 DIAGNOSIS — I10 Essential (primary) hypertension: Secondary | ICD-10-CM | POA: Diagnosis not present

## 2017-06-07 DIAGNOSIS — Z13220 Encounter for screening for lipoid disorders: Secondary | ICD-10-CM

## 2017-06-07 DIAGNOSIS — E559 Vitamin D deficiency, unspecified: Secondary | ICD-10-CM

## 2017-06-07 DIAGNOSIS — Z1329 Encounter for screening for other suspected endocrine disorder: Secondary | ICD-10-CM

## 2017-06-07 DIAGNOSIS — D649 Anemia, unspecified: Secondary | ICD-10-CM | POA: Diagnosis not present

## 2017-06-07 DIAGNOSIS — R739 Hyperglycemia, unspecified: Secondary | ICD-10-CM | POA: Diagnosis not present

## 2017-06-07 DIAGNOSIS — R7303 Prediabetes: Secondary | ICD-10-CM | POA: Insufficient documentation

## 2017-06-08 ENCOUNTER — Other Ambulatory Visit: Payer: Self-pay | Admitting: Internal Medicine

## 2017-06-08 ENCOUNTER — Encounter: Payer: Self-pay | Admitting: Internal Medicine

## 2017-06-08 DIAGNOSIS — E559 Vitamin D deficiency, unspecified: Secondary | ICD-10-CM

## 2017-06-08 LAB — COMPREHENSIVE METABOLIC PANEL
AG RATIO: 1.2 (calc) (ref 1.0–2.5)
ALKALINE PHOSPHATASE (APISO): 59 U/L (ref 33–115)
ALT: 22 U/L (ref 6–29)
AST: 19 U/L (ref 10–30)
Albumin: 3.7 g/dL (ref 3.6–5.1)
BILIRUBIN TOTAL: 0.3 mg/dL (ref 0.2–1.2)
BUN: 11 mg/dL (ref 7–25)
CALCIUM: 8.6 mg/dL (ref 8.6–10.2)
CHLORIDE: 108 mmol/L (ref 98–110)
CO2: 19 mmol/L — AB (ref 20–32)
Creat: 0.59 mg/dL (ref 0.50–1.10)
GLOBULIN: 3 g/dL (ref 1.9–3.7)
Glucose, Bld: 101 mg/dL — ABNORMAL HIGH (ref 65–99)
Potassium: 4.5 mmol/L (ref 3.5–5.3)
Sodium: 138 mmol/L (ref 135–146)
Total Protein: 6.7 g/dL (ref 6.1–8.1)

## 2017-06-08 LAB — TSH: TSH: 1.32 mIU/L

## 2017-06-08 LAB — HEMOGLOBIN A1C
EAG (MMOL/L): 6.8 (calc)
Hgb A1c MFr Bld: 5.9 % of total Hgb — ABNORMAL HIGH (ref <5.7)
MEAN PLASMA GLUCOSE: 123 (calc)

## 2017-06-08 LAB — CBC WITH DIFFERENTIAL/PLATELET
BASOS PCT: 0.8 %
Basophils Absolute: 49 cells/uL (ref 0–200)
EOS ABS: 323 {cells}/uL (ref 15–500)
Eosinophils Relative: 5.3 %
HCT: 34.4 % — ABNORMAL LOW (ref 35.0–45.0)
HEMOGLOBIN: 10.1 g/dL — AB (ref 11.7–15.5)
Lymphs Abs: 1891 cells/uL (ref 850–3900)
MCH: 19.1 pg — ABNORMAL LOW (ref 27.0–33.0)
MCHC: 29.4 g/dL — ABNORMAL LOW (ref 32.0–36.0)
MCV: 64.9 fL — ABNORMAL LOW (ref 80.0–100.0)
MPV: 11 fL (ref 7.5–12.5)
Monocytes Relative: 7.7 %
NEUTROS ABS: 3367 {cells}/uL (ref 1500–7800)
NEUTROS PCT: 55.2 %
Platelets: 292 10*3/uL (ref 140–400)
RBC: 5.3 10*6/uL — AB (ref 3.80–5.10)
RDW: 18.2 % — ABNORMAL HIGH (ref 11.0–15.0)
Total Lymphocyte: 31 %
WBC mixed population: 470 cells/uL (ref 200–950)
WBC: 6.1 10*3/uL (ref 3.8–10.8)

## 2017-06-08 LAB — IRON,TIBC AND FERRITIN PANEL
%SAT: 5 % — AB (ref 11–50)
FERRITIN: 6 ng/mL — AB (ref 10–232)
Iron: 20 ug/dL — ABNORMAL LOW (ref 40–190)
TIBC: 423 ug/dL (ref 250–450)

## 2017-06-08 LAB — LIPID PANEL
Cholesterol: 190 mg/dL (ref <200)
HDL: 46 mg/dL — AB (ref >50)
LDL Cholesterol (Calc): 120 mg/dL (calc) — ABNORMAL HIGH
Non-HDL Cholesterol (Calc): 144 mg/dL (calc) — ABNORMAL HIGH (ref <130)
TRIGLYCERIDES: 128 mg/dL (ref <150)
Total CHOL/HDL Ratio: 4.1 (calc) (ref <5.0)

## 2017-06-08 LAB — T4, FREE: Free T4: 1.1 ng/dL (ref 0.8–1.8)

## 2017-06-08 LAB — VITAMIN D 25 HYDROXY (VIT D DEFICIENCY, FRACTURES): Vit D, 25-Hydroxy: 20 ng/mL — ABNORMAL LOW (ref 30–100)

## 2017-06-08 MED ORDER — CHOLECALCIFEROL 1.25 MG (50000 UT) PO CAPS: 50000.0000 [IU] | ORAL_CAPSULE | ORAL | 1 refills | Status: DC

## 2017-06-09 DIAGNOSIS — I10 Essential (primary) hypertension: Secondary | ICD-10-CM | POA: Insufficient documentation

## 2017-06-09 DIAGNOSIS — R41840 Attention and concentration deficit: Secondary | ICD-10-CM | POA: Insufficient documentation

## 2017-06-09 DIAGNOSIS — D649 Anemia, unspecified: Secondary | ICD-10-CM | POA: Insufficient documentation

## 2017-06-12 ENCOUNTER — Telehealth: Payer: Self-pay | Admitting: Internal Medicine

## 2017-06-12 NOTE — Telephone Encounter (Signed)
Copied from CRM [id number]. Topic: Quick Communication - Lab Results >> Jun 12, 2017  2:47 PM Bronwen Betters, CMA wrote: Called patient to inform them of [id number] lab results. When patient returns call, triage nurse may disclose results.  Please contact.

## 2017-06-13 NOTE — Telephone Encounter (Signed)
Patient notified

## 2017-06-18 ENCOUNTER — Ambulatory Visit: Payer: Self-pay | Admitting: Internal Medicine

## 2017-06-20 ENCOUNTER — Telehealth: Payer: Self-pay | Admitting: Internal Medicine

## 2017-06-20 NOTE — Telephone Encounter (Signed)
ROI received from [facility] on 06/20/2017

## 2017-06-26 ENCOUNTER — Encounter: Payer: Self-pay | Admitting: Internal Medicine

## 2017-06-26 NOTE — Progress Notes (Signed)
mammo neg 09/07/16 and pap neg 09/06/16 Dr. Rana SnareLowe no comment on HPV status  Reviewed and scanned records into chart   TMS

## 2017-06-30 ENCOUNTER — Encounter: Payer: Self-pay | Admitting: Internal Medicine

## 2017-07-01 ENCOUNTER — Other Ambulatory Visit: Payer: Self-pay | Admitting: Internal Medicine

## 2017-07-01 ENCOUNTER — Telehealth: Payer: 59 | Admitting: Family

## 2017-07-01 DIAGNOSIS — B3731 Acute candidiasis of vulva and vagina: Secondary | ICD-10-CM

## 2017-07-01 DIAGNOSIS — B373 Candidiasis of vulva and vagina: Secondary | ICD-10-CM

## 2017-07-01 MED ORDER — FLUCONAZOLE 150 MG PO TABS: 150.0000 mg | ORAL_TABLET | Freq: Once | ORAL | 0 refills | Status: AC

## 2017-07-01 NOTE — Progress Notes (Signed)

## 2017-07-04 ENCOUNTER — Ambulatory Visit: Payer: 59 | Admitting: Internal Medicine

## 2017-07-04 DIAGNOSIS — Z0289 Encounter for other administrative examinations: Secondary | ICD-10-CM

## 2017-07-26 ENCOUNTER — Telehealth: Payer: 59 | Admitting: Family

## 2017-07-26 DIAGNOSIS — H60331 Swimmer's ear, right ear: Secondary | ICD-10-CM | POA: Diagnosis not present

## 2017-07-26 MED ORDER — CIPROFLOXACIN-DEXAMETHASONE 0.3-0.1 % OT SUSP: 4.0000 [drp] | Freq: Two times a day (BID) | OTIC | 0 refills | Status: DC

## 2017-07-26 NOTE — Progress Notes (Signed)
E Visit for Swimmer's Ear  We are sorry that you are not feeling well. Here is how we plan to help!  Based on what you have shared with me it looks like you have swimmers ear. Swimmer's ear is a redness or swelling, irritation, or infection of your outer ear canal.  These symptoms usually occur within a few days of swimming.  Your ear canal is a tube that goes from the opening of the ear to the eardrum.  When water stays in your ear canal, germs can grow.  This is a painful condition that often happens to children and swimmers of all ages.  It is not contagious and oral antibiotics are not required to treat uncomplicated swimmer's ear.  The usual symptoms include: Itching inside the ear, Redness or a sense of swelling in the ear, Pain when the ear is tugged on when pressure is placed on the ear, Pus draining from the infected ear. and I have prescribed: Ciprodex otic suspension 4 drops in affected ears twice daily until completed    In certain cases swimmer's ear may progress to a more serious bacterial infection of the middle or inner ear.  If you have a fever 102 and up and significantly worsening symptoms, this could indicate a more serious infection moving to the middle/inner and needs face to face evaluation in an office by a provider.  Your symptoms should improve over the next 3 days and should resolve in about 7 days.  HOME CARE:   Wash your hands frequently.  Do not place the tip of the bottle on your ear or touch it with your fingers.  You can take Acetominophen 650 mg every 4-6 hours as needed for pain.  If pain is severe or moderate, you can apply a heating pad (set on low) or hot water bottle (wrapped in a towel) to outer ear for 20 minutes.  This will also increase drainage.  Avoid ear plugs  Do not use Q-tips  After showers, help the water run out by tilting your head to one side.  GET HELP RIGHT AWAY IF:   Fever is over 102.2 degrees.  You develop progressive ear pain  or hearing loss.  Ear symptoms persist longer than 3 days after treatment.  MAKE SURE YOU:   Understand these instructions.  Will watch your condition.  Will get help right away if you are not doing well or get worse.  TO PREVENT SWIMMER'S EAR:  Use a bathing cap or custom fitted swim molds to keep your ears dry.  Towel off after swimming to dry your ears.  Tilt your head or pull your earlobes to allow the water to escape your ear canal.  If there is still water in your ears, consider using a hairdryer on the lowest setting.  Thank you for choosing an e-visit. Your e-visit answers were reviewed by a board certified advanced clinical practitioner to complete your personal care plan. Depending upon the condition, your plan could have included both over the counter or prescription medications. Please review your pharmacy choice. Be sure that the pharmacy you have chosen is open so that you can pick up your prescription now.  If there is a problem you may message your provider in MyChart to have the prescription routed to another pharmacy. Your safety is important to us. If you have drug allergies check your prescription carefully.  For the next 24 hours, you can use MyChart to ask questions about today's visit, request a non-urgent   call back, or ask for a work or school excuse from your e-visit provider. You will get an email in the next two days asking about your experience. I hope that your e-visit has been valuable and will speed your recovery.       

## 2017-07-26 NOTE — Addendum Note (Signed)
Addended by: Jannifer RodneyHAWKS, Shelbylynn Walczyk A on: 07/26/2017 01:44 PM   Modules accepted: Orders

## 2017-08-19 ENCOUNTER — Encounter: Payer: Self-pay | Admitting: Internal Medicine

## 2017-08-19 ENCOUNTER — Ambulatory Visit (INDEPENDENT_AMBULATORY_CARE_PROVIDER_SITE_OTHER): Payer: 59 | Admitting: Internal Medicine

## 2017-08-19 VITALS — BP 122/88 | HR 93 | Temp 98.6°F | Ht 64.0 in | Wt 280.0 lb

## 2017-08-19 DIAGNOSIS — Z1389 Encounter for screening for other disorder: Secondary | ICD-10-CM

## 2017-08-19 DIAGNOSIS — R7303 Prediabetes: Secondary | ICD-10-CM

## 2017-08-19 DIAGNOSIS — I10 Essential (primary) hypertension: Secondary | ICD-10-CM | POA: Diagnosis not present

## 2017-08-19 DIAGNOSIS — R5383 Other fatigue: Secondary | ICD-10-CM | POA: Diagnosis not present

## 2017-08-19 DIAGNOSIS — F419 Anxiety disorder, unspecified: Secondary | ICD-10-CM | POA: Insufficient documentation

## 2017-08-19 DIAGNOSIS — G4700 Insomnia, unspecified: Secondary | ICD-10-CM | POA: Diagnosis not present

## 2017-08-19 DIAGNOSIS — R41840 Attention and concentration deficit: Secondary | ICD-10-CM | POA: Diagnosis not present

## 2017-08-19 DIAGNOSIS — Z0184 Encounter for antibody response examination: Secondary | ICD-10-CM

## 2017-08-19 DIAGNOSIS — E559 Vitamin D deficiency, unspecified: Secondary | ICD-10-CM

## 2017-08-19 DIAGNOSIS — E785 Hyperlipidemia, unspecified: Secondary | ICD-10-CM

## 2017-08-19 DIAGNOSIS — Z1159 Encounter for screening for other viral diseases: Secondary | ICD-10-CM

## 2017-08-19 DIAGNOSIS — D500 Iron deficiency anemia secondary to blood loss (chronic): Secondary | ICD-10-CM

## 2017-08-19 MED ORDER — AMLODIPINE BESYLATE 2.5 MG PO TABS: 2.5000 mg | ORAL_TABLET | Freq: Every day | ORAL | 3 refills | Status: DC

## 2017-08-19 MED ORDER — ESCITALOPRAM OXALATE 20 MG PO TABS: 20.0000 mg | ORAL_TABLET | Freq: Every day | ORAL | 1 refills | Status: DC

## 2017-08-19 NOTE — Progress Notes (Signed)
Chief Complaint  Patient presents with  . Follow-up   F/u  1. Fatigue improved with vitamin d and iron 325 mg bid  2. HTN bp improved on norvasc 2.5 mg qd  3. Concentration issues appt 08/27/17 for psych testing    Review of Systems  Constitutional: Negative for malaise/fatigue and weight loss.  HENT: Negative for hearing loss.   Respiratory: Negative for shortness of breath.   Cardiovascular: Negative for chest pain.  Genitourinary:       +heavy menses   Skin: Negative for rash.  Neurological: Negative for dizziness.  Psychiatric/Behavioral: Negative for depression. The patient is not nervous/anxious.    Past Medical History:  Diagnosis Date  . Abnormal Pap smear   . Allergy   . Anemia   . Anxiety    no meds during pregnancy  . Asthma   . Chicken pox   . Chlamydia   . Depression   . Gastroesophageal reflux in pregancy    ocasional -  . Heart murmur   . Hypertension    PIH - No meds  . Hypokalemia    Potassium 2.7 August 02, 2011  . Hypokalemia   . Preeclampsia   . Tachycardia    Past Surgical History:  Procedure Laterality Date  . CESAREAN SECTION  2005, 2013   x 2  . WISDOM TOOTH EXTRACTION     Family History  Problem Relation Age of Onset  . Hypertension Mother   . Suicidality Father   . Alcohol abuse Father   . Heart disease Maternal Grandmother        valve replacement  . Hypertension Maternal Grandmother   . Asthma Son   . Cancer Paternal Grandmother        breast   . Diabetes Paternal Grandmother   . Depression Paternal Grandfather   . Heart disease Paternal Grandfather   . Other Neg Hx    Social History   Socioeconomic History  . Marital status: Married    Spouse name: Not on file  . Number of children: Not on file  . Years of education: Not on file  . Highest education level: Not on file  Occupational History  . Not on file  Social Needs  . Financial resource strain: Not on file  . Food insecurity:    Worry: Not on file   Inability: Not on file  . Transportation needs:    Medical: Not on file    Non-medical: Not on file  Tobacco Use  . Smoking status: Former Smoker    Packs/day: 0.25    Years: 1.50    Pack years: 0.37    Types: Cigarettes    Last attempt to quit: 1990    Years since quitting: 29.6  . Smokeless tobacco: Never Used  Substance and Sexual Activity  . Alcohol use: No    Alcohol/week: 0.0 oz  . Drug use: No  . Sexual activity: Yes    Birth control/protection: None  Lifestyle  . Physical activity:    Days per week: Not on file    Minutes per session: Not on file  . Stress: Not on file  Relationships  . Social connections:    Talks on phone: Not on file    Gets together: Not on file    Attends religious service: Not on file    Active member of club or organization: Not on file    Attends meetings of clubs or organizations: Not on file    Relationship status: Not  on file  . Intimate partner violence:    Fear of current or ex partner: Not on file    Emotionally abused: Not on file    Physically abused: Not on file    Forced sexual activity: Not on file  Other Topics Concern  . Not on file  Social History Narrative   Married    2 kids son and daughter    HS ed    Financial advocate    Smoked socially as teenager    No guns, wears seat belt, safe in relationship    Current Meds  Medication Sig  . albuterol (PROAIR HFA) 108 (90 Base) MCG/ACT inhaler Inhale into the lungs every 6 (six) hours as needed for wheezing or shortness of breath.  Marland Kitchen amLODipine (NORVASC) 2.5 MG tablet Take 1 tablet (2.5 mg total) by mouth daily. In am  . cetirizine (ZYRTEC) 10 MG tablet Take 10 mg by mouth daily.  . Cholecalciferol 50000 units capsule Take 1 capsule (50,000 Units total) by mouth once a week.  . escitalopram (LEXAPRO) 10 MG tablet 20 mg.   . tranexamic acid (LYSTEDA) 650 MG TABS tablet    No Known Allergies Recent Results (from the past 2160 hour(s))  Comprehensive metabolic panel      Status: Abnormal   Collection Time: 06/07/17  8:02 AM  Result Value Ref Range   Glucose, Bld 101 (H) 65 - 99 mg/dL    Comment: .            Fasting reference interval . For someone without known diabetes, a glucose value between 100 and 125 mg/dL is consistent with prediabetes and should be confirmed with a follow-up test. .    BUN 11 7 - 25 mg/dL   Creat 0.59 0.50 - 1.10 mg/dL   BUN/Creatinine Ratio NOT APPLICABLE 6 - 22 (calc)   Sodium 138 135 - 146 mmol/L   Potassium 4.5 3.5 - 5.3 mmol/L   Chloride 108 98 - 110 mmol/L   CO2 19 (L) 20 - 32 mmol/L   Calcium 8.6 8.6 - 10.2 mg/dL   Total Protein 6.7 6.1 - 8.1 g/dL   Albumin 3.7 3.6 - 5.1 g/dL   Globulin 3.0 1.9 - 3.7 g/dL (calc)   AG Ratio 1.2 1.0 - 2.5 (calc)   Total Bilirubin 0.3 0.2 - 1.2 mg/dL   Alkaline phosphatase (APISO) 59 33 - 115 U/L   AST 19 10 - 30 U/L   ALT 22 6 - 29 U/L  CBC with Differential/Platelet     Status: Abnormal   Collection Time: 06/07/17  8:02 AM  Result Value Ref Range   WBC 6.1 3.8 - 10.8 Thousand/uL   RBC 5.30 (H) 3.80 - 5.10 Million/uL   Hemoglobin 10.1 (L) 11.7 - 15.5 g/dL   HCT 34.4 (L) 35.0 - 45.0 %   MCV 64.9 (L) 80.0 - 100.0 fL   MCH 19.1 (L) 27.0 - 33.0 pg   MCHC 29.4 (L) 32.0 - 36.0 g/dL   RDW 18.2 (H) 11.0 - 15.0 %   Platelets 292 140 - 400 Thousand/uL   MPV 11.0 7.5 - 12.5 fL   Neutro Abs 3,367 1,500 - 7,800 cells/uL   Lymphs Abs 1,891 850 - 3,900 cells/uL   WBC mixed population 470 200 - 950 cells/uL   Eosinophils Absolute 323 15 - 500 cells/uL   Basophils Absolute 49 0 - 200 cells/uL   Neutrophils Relative % 55.2 %   Total Lymphocyte 31.0 %   Monocytes  Relative 7.7 %   Eosinophils Relative 5.3 %   Basophils Relative 0.8 %   Smear Review      Comment: . Red cell morphology appears unremarkable . Review of peripheral smear confirms automated results.   Hemoglobin A1c     Status: Abnormal   Collection Time: 06/07/17  8:02 AM  Result Value Ref Range   Hgb A1c MFr Bld  5.9 (H) <5.7 % of total Hgb    Comment: For someone without known diabetes, a hemoglobin  A1c value between 5.7% and 6.4% is consistent with prediabetes and should be confirmed with a  follow-up test. . For someone with known diabetes, a value <7% indicates that their diabetes is well controlled. A1c targets should be individualized based on duration of diabetes, age, comorbid conditions, and other considerations. . This assay result is consistent with an increased risk of diabetes. . Currently, no consensus exists regarding use of hemoglobin A1c for diagnosis of diabetes for children. .    Mean Plasma Glucose 123 (calc)   eAG (mmol/L) 6.8 (calc)  Lipid panel     Status: Abnormal   Collection Time: 06/07/17  8:02 AM  Result Value Ref Range   Cholesterol 190 <200 mg/dL   HDL 46 (L) >50 mg/dL   Triglycerides 128 <150 mg/dL   LDL Cholesterol (Calc) 120 (H) mg/dL (calc)    Comment: Reference range: <100 . Desirable range <100 mg/dL for primary prevention;   <70 mg/dL for patients with CHD or diabetic patients  with > or = 2 CHD risk factors. Marland Kitchen LDL-C is now calculated using the Martin-Hopkins  calculation, which is a validated novel method providing  better accuracy than the Friedewald equation in the  estimation of LDL-C.  Cresenciano Genre et al. Annamaria Helling. 0102;725(36): 2061-2068  (http://education.[facility].com/faq/FAQ164)    Total CHOL/HDL Ratio 4.1 <5.0 (calc)   Non-HDL Cholesterol (Calc) 144 (H) <130 mg/dL (calc)    Comment: For patients with diabetes plus 1 major ASCVD risk  factor, treating to a non-HDL-C goal of <100 mg/dL  (LDL-C of <70 mg/dL) is considered a therapeutic  option.   TSH     Status: None   Collection Time: 06/07/17  8:02 AM  Result Value Ref Range   TSH 1.32 mIU/L    Comment:           Reference Range .           > or = 20 Years  0.40-4.50 .                Pregnancy Ranges           First trimester    0.26-2.66           Second trimester    0.55-2.73           Third trimester    0.43-2.91   T4, free     Status: None   Collection Time: 06/07/17  8:02 AM  Result Value Ref Range   Free T4 1.1 0.8 - 1.8 ng/dL  VITAMIN D 25 Hydroxy (Vit-D Deficiency, Fractures)     Status: Abnormal   Collection Time: 06/07/17  8:02 AM  Result Value Ref Range   Vit D, 25-Hydroxy 20 (L) 30 - 100 ng/mL    Comment: Vitamin D Status         25-OH Vitamin D: . Deficiency:                    <20 ng/mL Insufficiency:  20 - 29 ng/mL Optimal:                 > or = 30 ng/mL . For 25-OH Vitamin D testing on patients on  D2-supplementation and patients for whom quantitation  of D2 and D3 fractions is required, the [facility]ureD(TM) 25-OH VIT D, (D2,D3), LC/MS/MS is recommended: order  code 708-335-9554 (patients >43yr). . For more information on this test, go to: http://education.[facility].com/faq/FAQ163 (This link is being provided for  informational/educational purposes only.)   Iron, TIBC and Ferritin Panel     Status: Abnormal   Collection Time: 06/07/17  8:02 AM  Result Value Ref Range   Iron 20 (L) 40 - 190 mcg/dL   TIBC 423 250 - 450 mcg/dL (calc)   %SAT 5 (L) 11 - 50 % (calc)   Ferritin 6 (L) 10 - 232 ng/mL   Objective  Body mass index is 48.06 kg/m. Wt Readings from Last 3 Encounters:  08/19/17 280 lb (127 kg)  06/04/17 278 lb (126.1 kg)  05/28/17 278 lb (126.1 kg)   Temp Readings from Last 3 Encounters:  08/19/17 98.6 F (37 C) (Oral)  06/04/17 98.9 F (37.2 C) (Oral)  05/28/17 98.1 F (36.7 C)   BP Readings from Last 3 Encounters:  08/19/17 122/88  06/04/17 (!) 142/80  05/28/17 (!) 132/100   Pulse Readings from Last 3 Encounters:  08/19/17 93  06/04/17 90  05/28/17 82    Physical Exam  Constitutional: She is oriented to person, place, and time. Vital signs are normal. She appears well-developed and well-nourished. She is cooperative.  HENT:  Head: Normocephalic and atraumatic.  Mouth/Throat:  Oropharynx is clear and moist and mucous membranes are normal.  Eyes: Pupils are equal, round, and reactive to light. Conjunctivae are normal.  Cardiovascular: Normal rate, regular rhythm and normal heart sounds.  Pulmonary/Chest: Effort normal and breath sounds normal.  Neurological: She is alert and oriented to person, place, and time. Gait normal.  Skin: Skin is warm, dry and intact.  Psychiatric: She has a normal mood and affect. Her speech is normal and behavior is normal. Judgment and thought content normal. Cognition and memory are normal.  Nursing note and vitals reviewed.   Assessment   1. Fatigue could be 2/2 vit D and iron def 2. HTN  3. Concentration  4. HM Plan   1.  Doing better with vit D and iron pt to disc with ob/gyn heavy menses 2.  Cont meds  3.  Psychology appt pending 08/27/17  4.  Had flu shot 2018  ? Date Tdap thinks had 2015/2016  ? If had pna 23  Mammogram and pap 08/2017 [facility] make sure have results faxed   Never had colonoscopy  [place] hep [place] titer and MMR titer with work will repeat   [facility]  [place] [facility]  [place] [facility]    Provider: Dr. TOlivia MackieMcLean-Scocuzza-[facility]

## 2017-08-19 NOTE — Progress Notes (Signed)
Pre visit review using our clinic review tool, if applicable. No additional management support is needed unless otherwise documented below in the visit note. 

## 2017-08-19 NOTE — Patient Instructions (Addendum)
Please talk to Dr. Rana SnareLowe about heavy cycles and have him fax me pap and mammogram  Vitamin D3 5000 IU daily after 6 months  F/u 02/2018      Eating Healthy on a Budget There are many ways to save money at the grocery store and continue to eat healthy. You can be successful if you plan your meals according to your budget, purchase according to your budget and grocery list, and prepare food yourself. How can I buy more food on a limited budget? Plan  Plan meals and snacks according to a grocery list and budget you create.  Look for recipes where you can cook once and make enough food for two meals.  Include meals that will "stretch" more expensive foods such as stews, casseroles, and stir-fry dishes.  Make a grocery list and make sure to bring it with you to the store. If you have a smart phone, you could use your phone to create your shopping list. Purchase  When grocery shopping, buy only the items on your grocery list and go only to the areas of the store that have the items on your list. Prepare  Some meal items can be prepared in advance. Pre-cook on days when you have extra time.  Make extra food (such as by doubling recipes) and freeze the extras in meal-sized containers or in individual portions for fast meals and snacks.  Use leftovers in your meal plan for the week.  Try some meatless meals or try "no cook" meals like salads.  When you come home from the grocery store, wash and prepare your fruits and vegetables so they are ready to use and eat. This will help reduce food waste. How can I buy more food on a limited budget? Try these tips the next time you go shopping:  [employer] store brands or generic brands.  Use coupons only for foods and brands you normally buy. Avoid buying items you wouldn't normally buy simply because they are on sale.  Check online and in newspapers for weekly deals.  Buy healthy items from the bulk bins when available, such as herbs, spices, flours,  pastas, nuts, and dried fruit.  Buy fruits and vegetables that are in season. Prices are usually lower on in-season produce.  Compare and contrast different items. You can do this by looking at the unit price on the price tag. Use it to compare different brands and sizes to find out which item is the best deal.  Choose naturally low-cost healthy items, such as carrots, potatoes, apples, bananas, and oranges. Dried or canned beans are a low-cost protein source.  Buy in bulk and freeze extra food. Items you can buy in bulk include meats, fish, poultry, frozen fruits, and frozen vegetables.  Limit the purchase of prepared or "ready-to-eat" foods, such as pre-cut fruits and vegetables and pre-made salads.  If possible, shop around to discover which grocery store offers the best prices. Some stores charge much more than other stores for the same items.  Do not shop when you are hungry. If you shop while hungry, It may be hard to stick to your list and budget.  Stick to your list and resist impulse buys. Treat your list as your official plan for the week.  Buy a variety of vegetables and fruit by purchasing fresh, frozen, and canned items.  Look beyond eye level. Foods at eye level (adult or child eye level) are more expensive. Look at the top and bottom shelves for deals.  Be efficient with your time when shopping. The more time you spend at the store, the more money you are likely to spend.  Consider other retailers such as dollar stores, larger [employer], local fruit and vegetable stands, and farmers markets.  What are some tips for less expensive food substitutions? When choosing more expensive foods like meats and dairy, try these tips to save money:  Choose cheaper cuts of meat, such as bone-in chicken thighs and drumsticks instead skinless and boneless chicken. When you are ready to prepare the chicken, you can remove the skin yourself to make it healthier.  Choose lean meats  like chicken or [place]. When choosing ground beef, make sure it is lean ground beef (92% lean, 8% fat). If you do buy a fattier ground beef, drain the fat before eating.  Buy dried beans and peas, such as lentils, split peas, or kidney beans.  For seafood, choose canned tuna, salmon, or sardines.  Eggs are a low-cost source of protein.  Buy the larger tubs of yogurt instead of individual-sized containers.  Choose water instead of sodas and other sweetened beverages.  Skip buying chips, cookies, and other "junk food". These items are usually expensive, high in calories, and low in nutritional value.  How can I prepare the foods I buy in the healthiest way? Practice these tips for cooking foods in the healthiest way to reduce excess fat and calorie intake:  Steam, saute, grill, or bake foods instead of frying them.  Make sure half your plate is filled with fruits or vegetables. Choose from fresh, frozen, or canned fruits and vegetables. If eating canned, remember to rinse them before eating. This will remove any excess salt added for packaging.  Trim all fat from meat before cooking. Remove the skin from chicken or [place].  Spoon off fat from meat dishes once they have been chilled in the refrigerator and the fat has hardened on the top.  Use skim milk, low-fat milk, or evaporated skim milk when making cream sauces, soups, or puddings.  Substitute low-fat yogurt, sour cream, or cottage cheese for sour cream and mayonnaise in dips and dressings.  Try lemon juice, herbs, or spices to season food instead of salt, butter, or margarine.  This information is not intended to replace advice given to you by your health care provider. Make sure you discuss any questions you have with your health care provider. Document Released: 09/04/2013 Document Revised: 07/22/2015 Document Reviewed: 08/04/2013 Elsevier Interactive Patient Education  2018 [employer].  Mediterranean Diet A Mediterranean  diet refers to food and lifestyle choices that are based on the traditions of countries located on the [employer]. This way of eating has been shown to help prevent certain conditions and improve outcomes for people who have chronic diseases, like kidney disease and heart disease. What are tips for following this plan? Lifestyle  Cook and eat meals together with your family, when possible.  Drink enough fluid to keep your urine clear or pale yellow.  Be physically active every day. This includes: ? Aerobic exercise like running or swimming. ? Leisure activities like gardening, walking, or housework.  Get 7-8 hours of sleep each night.  If recommended by your health care provider, drink red wine in moderation. This means 1 glass a day for nonpregnant women and 2 glasses a day for men. A glass of wine equals 5 oz (150 mL). Reading food labels  Check the serving size of packaged foods. For foods such as rice and  pasta, the serving size refers to the amount of cooked product, not dry.  Check the total fat in packaged foods. Avoid foods that have saturated fat or trans fats.  Check the ingredients list for added sugars, such as corn syrup. Shopping  At the grocery store, buy most of your food from the areas near the walls of the store. This includes: ? Fresh fruits and vegetables (produce). ? Grains, beans, nuts, and seeds. Some of these may be available in unpackaged forms or large amounts (in bulk). ? Fresh seafood. ? Poultry and eggs. ? Low-fat dairy products.  Buy whole ingredients instead of prepackaged foods.  Buy fresh fruits and vegetables in-season from local farmers markets.  Buy frozen fruits and vegetables in resealable bags.  If you do not have access to quality fresh seafood, buy precooked frozen shrimp or canned fish, such as tuna, salmon, or sardines.  Buy small amounts of raw or cooked vegetables, salads, or olives from the deli or salad bar at your  store.  Stock your pantry so you always have certain foods on hand, such as olive oil, canned tuna, canned tomatoes, rice, pasta, and beans. Cooking  Cook foods with extra-virgin olive oil instead of using butter or other vegetable oils.  Have meat as a side dish, and have vegetables or grains as your main dish. This means having meat in small portions or adding small amounts of meat to foods like pasta or stew.  Use beans or vegetables instead of meat in common dishes like chili or lasagna.  Experiment with different cooking methods. Try roasting or broiling vegetables instead of steaming or sauteing them.  Add frozen vegetables to soups, stews, pasta, or rice.  Add nuts or seeds for added healthy fat at each meal. You can add these to yogurt, salads, or vegetable dishes.  Marinate fish or vegetables using olive oil, lemon juice, garlic, and fresh herbs. Meal planning  Plan to eat 1 vegetarian meal one day each week. Try to work up to 2 vegetarian meals, if possible.  Eat seafood 2 or more times a week.  Have healthy snacks readily available, such as: ? Vegetable sticks with hummus. ? [place] yogurt. ? Fruit and nut trail mix.  Eat balanced meals throughout the week. This includes: ? Fruit: 2-3 servings a day ? Vegetables: 4-5 servings a day ? Low-fat dairy: 2 servings a day ? Fish, poultry, or lean meat: 1 serving a day ? Beans and legumes: 2 or more servings a week ? Nuts and seeds: 1-2 servings a day ? Whole grains: 6-8 servings a day ? Extra-virgin olive oil: 3-4 servings a day  Limit red meat and sweets to only a few servings a month What are my food choices?  Mediterranean diet ? Recommended ? Grains: Whole-grain pasta. Brown rice. Bulgar wheat. Polenta. Couscous. Whole-wheat bread. Orpah Cobb. ? Vegetables: Artichokes. Beets. Broccoli. Cabbage. Carrots. Eggplant. Green beans. Chard. Kale. Spinach. Onions. Leeks. Peas. Squash. Tomatoes. Peppers.  Radishes. ? Fruits: Apples. Apricots. Avocado. Berries. Bananas. Cherries. Dates. Figs. Grapes. Lemons. Melon. Oranges. Peaches. Plums. Pomegranate. ? Meats and other protein foods: Beans. Almonds. Sunflower seeds. Pine nuts. Peanuts. Cod. Salmon. Scallops. Shrimp. Tuna. Tilapia. Clams. Oysters. Eggs. ? Dairy: Low-fat milk. Cheese. Greek yogurt. ? Beverages: Water. Red wine. Herbal tea. ? Fats and oils: Extra virgin olive oil. Avocado oil. Grape seed oil. ? Sweets and desserts: [place] yogurt with honey. Baked apples. Poached pears. Trail mix. ? Seasoning and other foods: Basil. Cilantro. Coriander. Cumin.  Mint. Parsley. Sage. Rosemary. Tarragon. Garlic. Oregano. Thyme. Pepper. Balsalmic vinegar. Tahini. Hummus. Tomato sauce. Olives. Mushrooms. ? Limit these ? Grains: Prepackaged pasta or rice dishes. Prepackaged cereal with added sugar. ? Vegetables: Deep fried potatoes (french fries). ? Fruits: Fruit canned in syrup. ? Meats and other protein foods: Beef. Pork. Lamb. Poultry with skin. Hot dogs. Tomasa Blase. ? Dairy: Ice cream. Sour cream. Whole milk. ? Beverages: Juice. Sugar-sweetened soft drinks. Beer. Liquor and spirits. ? Fats and oils: Butter. Canola oil. Vegetable oil. Beef fat (tallow). Lard. ? Sweets and desserts: Cookies. Cakes. Pies. Candy. ? Seasoning and other foods: Mayonnaise. Premade sauces and marinades. ? The items listed may not be a complete list. Talk with your dietitian about what dietary choices are right for you. Summary  The Mediterranean diet includes both food and lifestyle choices.  Eat a variety of fresh fruits and vegetables, beans, nuts, seeds, and whole grains.  Limit the amount of red meat and sweets that you eat.  Talk with your health care provider about whether it is safe for you to drink red wine in moderation. This means 1 glass a day for nonpregnant women and 2 glasses a day for men. A glass of wine equals 5 oz (150 mL). This information is not intended to  replace advice given to you by your health care provider. Make sure you discuss any questions you have with your health care provider. Document Released: 08/25/2015 Document Revised: 09/27/2015 Document Reviewed: 08/25/2015 Elsevier Interactive Patient Education  2018 [employer].    Cholesterol Cholesterol is a white, waxy, fat-like substance that is needed by the human body in small amounts. The liver makes all the cholesterol we need. Cholesterol is carried from the liver by the blood through the blood vessels. Deposits of cholesterol (plaques) may build up on blood vessel (artery) walls. Plaques make the arteries narrower and stiffer. Cholesterol plaques increase the risk for heart attack and stroke. You cannot feel your cholesterol level even if it is very high. The only way to know that it is high is to have a blood test. Once you know your cholesterol levels, you should keep a record of the test results. Work with your health care provider to keep your levels in the desired range. What do the results mean?  Total cholesterol is a rough measure of all the cholesterol in your blood.  LDL (low-density lipoprotein) is the "bad" cholesterol. This is the type that causes plaque to build up on the artery walls. You want this level to be low.  HDL (high-density lipoprotein) is the "good" cholesterol because it cleans the arteries and carries the LDL away. You want this level to be high.  Triglycerides are fat that the body can either burn for energy or store. High levels are closely linked to heart disease. What are the desired levels of cholesterol?  Total cholesterol below 200.  LDL below 100 for people who are at risk, below 70 for people at very high risk.  HDL above 40 is good. A level of 60 or higher is considered to be protective against heart disease.  Triglycerides below 150. How can I lower my cholesterol? Diet Follow your diet program as told by your health care  provider.  Choose fish or white meat chicken and [place], roasted or baked. Limit fatty cuts of red meat, fried foods, and processed meats, such as sausage and lunch meats.  Eat lots of fresh fruits and vegetables.  Choose whole grains, beans, pasta, potatoes,  and cereals.  Choose olive oil, corn oil, or canola oil, and use only small amounts.  Avoid butter, mayonnaise, shortening, or palm kernel oils.  Avoid foods with trans fats.  Drink skim or nonfat milk and eat low-fat or nonfat yogurt and cheeses. Avoid whole milk, cream, ice cream, egg yolks, and full-fat cheeses.  Healthier desserts include angel food cake, ginger snaps, animal crackers, hard candy, popsicles, and low-fat or nonfat frozen yogurt. Avoid pastries, cakes, pies, and cookies.  Exercise  Follow your exercise program as told by your health care provider. A regular program: ? Helps to decrease LDL and raise HDL. ? Helps with weight control.  Do things that increase your activity level, such as gardening, walking, and taking the stairs.  Ask your health care provider about ways that you can be more active in your daily life.  Medicine  Take over-the-counter and prescription medicines only as told by your health care provider. ? Medicine may be prescribed by your health care provider to help lower cholesterol and decrease the risk for heart disease. This is usually done if diet and exercise have failed to bring down cholesterol levels. ? If you have several risk factors, you may need medicine even if your levels are normal.  This information is not intended to replace advice given to you by your health care provider. Make sure you discuss any questions you have with your health care provider. Document Released: 09/26/2000 Document Revised: 07/30/2015 Document Reviewed: 07/02/2015 Elsevier Interactive Patient Education  [employer].

## 2017-08-20 LAB — URINALYSIS, ROUTINE W REFLEX MICROSCOPIC
BILIRUBIN UA: NEGATIVE
GLUCOSE, UA: NEGATIVE
KETONES UA: NEGATIVE
Leukocytes, UA: NEGATIVE
Nitrite, UA: NEGATIVE
PROTEIN UA: NEGATIVE
RBC, UA: NEGATIVE
SPEC GRAV UA: 1.015 (ref 1.005–1.030)
UUROB: 0.2 mg/dL (ref 0.2–1.0)
pH, UA: 7 (ref 5.0–7.5)

## 2017-08-27 ENCOUNTER — Ambulatory Visit: Payer: Self-pay | Admitting: Psychology

## 2017-12-11 ENCOUNTER — Other Ambulatory Visit: Payer: Self-pay

## 2018-02-03 ENCOUNTER — Encounter: Payer: Self-pay | Admitting: Internal Medicine

## 2018-02-04 ENCOUNTER — Encounter: Payer: Self-pay | Admitting: Internal Medicine

## 2018-02-04 ENCOUNTER — Ambulatory Visit: Payer: 59 | Admitting: Internal Medicine

## 2018-02-04 ENCOUNTER — Ambulatory Visit (INDEPENDENT_AMBULATORY_CARE_PROVIDER_SITE_OTHER): Payer: Self-pay | Admitting: Physician Assistant

## 2018-02-04 ENCOUNTER — Encounter: Payer: Self-pay | Admitting: Physician Assistant

## 2018-02-04 VITALS — BP 140/88 | HR 84 | Temp 98.4°F | Ht 66.0 in | Wt 281.6 lb

## 2018-02-04 VITALS — BP 130/100 | HR 97 | Temp 98.8°F | Resp 16 | Ht 66.0 in | Wt 281.0 lb

## 2018-02-04 DIAGNOSIS — R42 Dizziness and giddiness: Secondary | ICD-10-CM

## 2018-02-04 DIAGNOSIS — E559 Vitamin D deficiency, unspecified: Secondary | ICD-10-CM | POA: Diagnosis not present

## 2018-02-04 DIAGNOSIS — M2550 Pain in unspecified joint: Secondary | ICD-10-CM

## 2018-02-04 DIAGNOSIS — D500 Iron deficiency anemia secondary to blood loss (chronic): Secondary | ICD-10-CM | POA: Diagnosis not present

## 2018-02-04 DIAGNOSIS — W57XXXD Bitten or stung by nonvenomous insect and other nonvenomous arthropods, subsequent encounter: Secondary | ICD-10-CM

## 2018-02-04 DIAGNOSIS — F419 Anxiety disorder, unspecified: Secondary | ICD-10-CM

## 2018-02-04 DIAGNOSIS — E538 Deficiency of other specified B group vitamins: Secondary | ICD-10-CM | POA: Diagnosis not present

## 2018-02-04 DIAGNOSIS — I10 Essential (primary) hypertension: Secondary | ICD-10-CM

## 2018-02-04 DIAGNOSIS — R7303 Prediabetes: Secondary | ICD-10-CM | POA: Diagnosis not present

## 2018-02-04 DIAGNOSIS — R5383 Other fatigue: Secondary | ICD-10-CM

## 2018-02-04 DIAGNOSIS — E611 Iron deficiency: Secondary | ICD-10-CM | POA: Diagnosis not present

## 2018-02-04 LAB — CBC WITH DIFFERENTIAL/PLATELET
BASOS ABS: 0.1 10*3/uL (ref 0.0–0.1)
Basophils Relative: 0.8 % (ref 0.0–3.0)
Eosinophils Absolute: 0.5 10*3/uL (ref 0.0–0.7)
Eosinophils Relative: 6.6 % — ABNORMAL HIGH (ref 0.0–5.0)
HCT: 45.4 % (ref 36.0–46.0)
Hemoglobin: 14.7 g/dL (ref 12.0–15.0)
Lymphocytes Relative: 35.1 % (ref 12.0–46.0)
Lymphs Abs: 2.5 10*3/uL (ref 0.7–4.0)
MCHC: 32.3 g/dL (ref 30.0–36.0)
MCV: 79.9 fl (ref 78.0–100.0)
Monocytes Absolute: 0.5 10*3/uL (ref 0.1–1.0)
Monocytes Relative: 6.9 % (ref 3.0–12.0)
Neutro Abs: 3.7 10*3/uL (ref 1.4–7.7)
Neutrophils Relative %: 50.6 % (ref 43.0–77.0)
PLATELETS: 288 10*3/uL (ref 150.0–400.0)
RBC: 5.69 Mil/uL — ABNORMAL HIGH (ref 3.87–5.11)
RDW: 15.4 % (ref 11.5–15.5)
WBC: 7.2 10*3/uL (ref 4.0–10.5)

## 2018-02-04 LAB — COMPREHENSIVE METABOLIC PANEL
ALT: 50 U/L — AB (ref 0–35)
AST: 44 U/L — AB (ref 0–37)
Albumin: 4.1 g/dL (ref 3.5–5.2)
Alkaline Phosphatase: 59 U/L (ref 39–117)
BUN: 9 mg/dL (ref 6–23)
CALCIUM: 9.7 mg/dL (ref 8.4–10.5)
CHLORIDE: 104 meq/L (ref 96–112)
CO2: 26 meq/L (ref 19–32)
CREATININE: 0.57 mg/dL (ref 0.40–1.20)
GFR: 116.26 mL/min (ref >60.00)
GLUCOSE: 81 mg/dL (ref 70–99)
Potassium: 4 mEq/L (ref 3.5–5.1)
SODIUM: 139 meq/L (ref 135–145)
Total Bilirubin: 0.3 mg/dL (ref 0.2–1.2)
Total Protein: 7.9 g/dL (ref 6.0–8.3)

## 2018-02-04 LAB — VITAMIN D 25 HYDROXY (VIT D DEFICIENCY, FRACTURES): VITD: 46.56 ng/mL (ref 30.00–100.00)

## 2018-02-04 LAB — TSH: TSH: 1.12 u[IU]/mL (ref 0.35–4.50)

## 2018-02-04 LAB — HEMOGLOBIN A1C: Hgb A1c MFr Bld: 5.9 % (ref 4.6–6.5)

## 2018-02-04 LAB — C-REACTIVE PROTEIN: CRP: 0.7 mg/dL (ref 0.5–20.0)

## 2018-02-04 LAB — SEDIMENTATION RATE: Sed Rate: 40 mm/hr — ABNORMAL HIGH (ref 0–20)

## 2018-02-04 NOTE — Patient Instructions (Addendum)
Thank you for choosing [facility] for you health care needs.  Follow-up with your family physician, Dr. French Ana McLean-Scocuzza, at 1pm today. Arrive for appointment at 12:45pm.  Hope you feel better soon!  Dizziness Dizziness is a common problem. It makes you feel unsteady or light-headed. You may feel like you are about to pass out (faint). Dizziness can lead to getting hurt if you stumble or fall. Dizziness can be caused by many things, including:  Medicines.  Not having enough water in your body (dehydration).  Illness. Follow these instructions at home: Eating and drinking   Drink enough fluid to keep your pee (urine) clear or pale yellow. This helps to keep you from getting dehydrated. Try to drink more clear fluids, such as water.  Do not drink alcohol.  Limit how much caffeine you drink or eat, if your doctor tells you to do that.  Limit how much salt (sodium) you drink or eat, if your doctor tells you to do that. Activity   Avoid making quick movements. ? When you stand up from sitting in a chair, steady yourself until you feel okay. ? In the morning, first sit up on the side of the bed. When you feel okay, stand slowly while you hold onto something. Do this until you know that your balance is fine.  If you need to stand in one place for a long time, move your legs often. Tighten and relax the muscles in your legs while you are standing.  Do not drive or use heavy machinery if you feel dizzy.  Avoid bending down if you feel dizzy. Place items in your home so you can reach them easily without leaning over. Lifestyle  Do not use any products that contain nicotine or tobacco, such as cigarettes and e-cigarettes. If you need help quitting, ask your doctor.  Try to lower your stress level. You can do this by using methods such as yoga or meditation. Talk with your doctor if you need help. General instructions  Watch your dizziness for any changes.  Take  over-the-counter and prescription medicines only as told by your doctor. Talk with your doctor if you think that you are dizzy because of a medicine that you are taking.  Tell a friend or a family member that you are feeling dizzy. If he or she notices any changes in your behavior, have this person call your doctor.  Keep all follow-up visits as told by your doctor. This is important. Contact a doctor if:  Your dizziness does not go away.  Your dizziness or light-headedness gets worse.  You feel sick to your stomach (nauseous).  You have trouble hearing.  You have new symptoms.  You are unsteady on your feet.  You feel like the room is spinning. Get help right away if:  You throw up (vomit) or have watery poop (diarrhea), and you cannot eat or drink anything.  You have trouble: ? Talking. ? Walking. ? Swallowing. ? Using your arms, hands, or legs.  You feel generally weak.  You are not thinking clearly, or you have trouble forming sentences. A friend or family member may notice this.  You have: ? Chest pain. ? Pain in your belly (abdomen). ? Shortness of breath. ? Sweating.  Your vision changes.  You are bleeding.  You have a very bad headache.  You have neck pain or a stiff neck.  You have a fever. These symptoms may be an emergency. Do not wait to see if the symptoms  will go away. Get medical help right away. Call your local emergency services (911 in the [place] Do not drive yourself to the hospital. Summary  Dizziness makes you feel unsteady or light-headed. You may feel like you are about to pass out (faint).  Drink enough fluid to keep your pee (urine) clear or pale yellow. Do not drink alcohol.  Avoid making quick movements if you feel dizzy.  Watch your dizziness for any changes. This information is not intended to replace advice given to you by your health care provider. Make sure you discuss any questions you have with your health care  provider. Document Released: 12/21/2010 Document Revised: 01/19/2016 Document Reviewed: 01/19/2016 Elsevier Interactive Patient Education  2019 [employer]

## 2018-02-04 NOTE — Progress Notes (Signed)
Patient ID: Kimberly [name] DOB: 1976-01-01 AGE: 43 y.o. MRN: [id number]   PCP: McLean-Scocuzza, Nino Glow, MD   Chief Complaint:  Chief Complaint  Patient presents with  . Fatigue    x1wk     Subjective:    HPI:  Kimberly [name] is a 43 y.o. female presents for evaluation  Chief Complaint  Patient presents with  . Fatigue    x73wk    43 year old female presents with 1 week history of disequilibrium. Feels like she is walking like she is drunk. Most noticeable when walking down stairs. Feels like she cannot trust her foot will hit the step correctly; risk of falling. 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 Patient with seasonal allergies; does report bilateral ear fullness, with popping/crackling, right worse than left. Has Claritin, takes intermittently. Does not use nasal spray. Former cigarette smoker, quit in 1990.  Patient with no previous history of peripheral neuropathy or parkinson's disease. Denies room spinning sensation or sense of movement when still. Cannot elicit dizziness with quickly turning head or bending head down. Denies presyncope sensation. Denies prevalence of symptoms with moving quickly from lying down position to standing up.  Denies fever, chills, headache (does report occasional crackling/popping in occipital area/upper portion of posterior neck), tinnitus, URI symptoms (nasal congestion, sinus pressure, sore throat, cough), chest pain, palpitations, racing heart, shortness of breath, wheezing, nausea/vomiting, arm weakness/paresthesias, does not feel equilibrium issues more one sided. Patient with anxiety disorder; currently managed on Lexapro.  Patient does report daytime somnolence; feels she could fall asleep while driving, while reading on her computer, while watching tv. Admits to snoring. Has never undergone sleep testing.  Patient with  prescription for VitD supplement; takes regularly, once a week as prescribed. Patient with prescription for Norvasc 2.59m for HTN (blood pressure today 130/100); admits to not taking. Has only taken rarely/intermittently for past several months. Patient with prescription for iron supplement; prescription ran out 1-2 months ago, stopped medication (never got around to refilling prescription). Due to symptoms, restarted medication one week ago. Has not noticed any change in symptoms.  Patient with previous history of cardiac issues. Managed by Dr. CNelva BushMD (2017) and Dr. JCleatis Polka(4 years prior) [facility] Originally, symptoms attributed to hypokalemia. Visit several years later, symptoms improved with Magnesium and Potassium supplement. Patient states current symptoms feel different.  Patient interested in undergoing evaluation by ENT; states co-worker has similar symptoms and [facility].  Patient sent her PCP an e-message yesterday, 02/03/2018, stating she was tired and had "brain fog" again. Stated she was also having a hard time focusing. Her PCP replied, advised she see therapy for ADD/ADHD/focus testing and anxiety - referral sent in 2019. Also discussed her taking her iron and VitD3 supplement. Patient's next scheduled appointment on 02/19/2018 (in two weeks).  Patient has labs ordered, pending, for BMP, lipid panel, VitD level, MMR titer, and HepB surface antibody.  Patient regularly followed [facility] seen 08/19/2017. Seen as f/u for fatigue  (improved with Iron and VitD3 supplement), HTN (improved on Norvasc 2.566mqd), and concentration issues (referred to psych testing).  Patient established care with PCP on 06/04/2017. At that appointment, complained of fatigue and "fogginess" at times. Stated brain fog had been present for 1 onth. Associated difficulty sleeping, lack  of energy, and difficulty concentration during  the day.   A limited review of symptoms was performed, pertinent positives and negatives as mentioned in HPI.  The following portions of the patient's history were reviewed and updated as appropriate: allergies, current medications and past medical history.  Patient Active Problem List   Diagnosis Date Noted  . Anxiety 08/19/2017  . Essential hypertension 06/09/2017  . Anemia 06/09/2017  . Lack of concentration 06/09/2017  . Vitamin D deficiency 06/07/2017  . Prediabetes 06/07/2017  . Insomnia 06/04/2017  . Fatigue 06/04/2017  . Obesity, Class III, BMI 40-49.9 (morbid obesity) ([place]) 06/04/2017    No Known Allergies  Current Outpatient Medications on File Prior to Visit  Medication Sig Dispense Refill  . albuterol (PROAIR HFA) 108 (90 Base) MCG/ACT inhaler Inhale into the lungs every 6 (six) hours as needed for wheezing or shortness of breath.    Marland Kitchen amLODipine (NORVASC) 2.5 MG tablet Take 1 tablet (2.5 mg total) by mouth daily. In am 90 tablet 3  . cetirizine (ZYRTEC) 10 MG tablet Take 10 mg by mouth daily.    . Cholecalciferol 50000 units capsule Take 1 capsule (50,000 Units total) by mouth once a week. 13 capsule 1  . escitalopram (LEXAPRO) 20 MG tablet Take 1 tablet (20 mg total) by mouth daily. 90 tablet 1  . ferrous sulfate 325 (65 FE) MG tablet Take 325 mg by mouth 2 (two) times daily with a meal.     No current facility-administered medications on file prior to visit.        Objective:   Vitals:   02/04/18 0825  BP: (!) 130/100  Pulse: 97  Resp: 16  Temp: 98.8 F (37.1 C)  SpO2: 98%     Wt Readings from Last 3 Encounters:  02/04/18 281 lb (127.5 kg)  08/19/17 280 lb (127 kg)  06/04/17 278 lb (126.1 kg)    Physical Exam:   General Appearance:  Patient sitting comfortably on examination table. Conversational. Kermit Balo self-historian. In no acute distress. Afebrile.   Head:  Normocephalic, without obvious  abnormality, atraumatic  Eyes:  PERRL. No photosensitivity. Conjunctiva/corneas clear, EOM's intact. Active eye watering. No nystagmus. No injection/erythema. No chemosis.   Ears:  Bilateral ear canals WNL. No erythema or edema. No discharge/drainage. Bilateral TMs WNL. No erythema, injection, or serous effusion. No scar tissue.  Nose: Nares normal, septum midline. No discharge. Normal mucosa. No sinus tenderness with percussion/palpation.  Throat: Lips, mucosa, and tongue normal; teeth and gums normal. Throat reveals no erythema. Tonsils with no enlargement or exudate.  Neck: Supple, symmetrical, trachea midline, no adenopathy. Full ROM without eliciting pain. No audible carotid bruit.  Lungs:   Clear to auscultation bilaterally, respirations unlabored  Heart:  Regular rate and rhythm, S1 and S2 normal, no murmur, rub, or gallop.  Extremities: Extremities normal, atraumatic, no cyanosis or edema. No pronator drift. Negative Romberg testing. Finger to nose WNL. No gait abnormality. Patient able to balance on one foot with eyes closed; equal balance bilaterally. Able to walk on only toes. Able to walk toe to heel. 5/5 upper and lower extremity strength. Sensation intact. Brisk capillary refill.  Pulses: 2+ and symmetric. No peripheral edema.  Skin: Skin color, texture, turgor normal, no rashes or lesions  Lymph nodes: Cervical, supraclavicular, and axillary nodes normal  Neurologic: Normal    Assessment & Plan:    Exam findings, diagnosis etiology and medication use and indications reviewed with patient. Follow-Up and discharge instructions provided. No emergent/urgent issues found on exam.  Patient education was  provided.   Patient verbalized understanding of information provided and agrees with plan of care (POC), all questions answered. The patient is advised to call or return to clinic if condition does not see an improvement in symptoms, or to seek the care of the closest emergency  department if condition worsens with the below plan.    1. Disequilibrium  2. Fatigue, unspecified type  43 year old female with 1 week history of disequilibrium and fatigue/daytime somnolence. Acute on chronic issue. Currently managed by PCP. Believe extensive work-up warranted. Patient would benefit from repeat labwork (CBC, TIBC, Ferritin level, thyroid panel, CMP, etc.) given history of iron-deficiency anemia (patient not taking prescribed medication) and history of electrolyte abnormality.  Also believe a sleep study is warranted for further evaluation of OSA - given patient's BMI, neck circumference, HTN history, and daytime somonolence.  Patient's symptoms could be due to her depression, believe f/u with psychologist for further evaluation could be helpful (as was advised by PCP earlier).  Patient may also warrant neurology evaluation. No disequilibrium noted on physical examination. However, given uncontrolled HTN, MRI of the brain may be warranted for possible vertebrobasillar CVA, acoustic neuroma, MS.  Discussed with patient. She should take her iron supplement and her Norvasc (hypertension medication) as prescribed.  Patient's vital signs stable. Benign physical examination. Concerning symptoms; however, do not believe patient needs seen in the ED now. Called patient's PCP. Available appointment today at 1pm. Made appointment for patient. She will be seen later today.   Darlin Priestly, MHS, PA-C Montey Hora, MHS, PA-C Advanced Practice Provider [facility]  [address] [facility], [place], [place] [address] (p):  [phone] [email or web address]

## 2018-02-04 NOTE — Progress Notes (Signed)
Chief Complaint  Patient presents with  . Fatigue   F/u  1. Fatigue though sleeping 8 hrs. Also c/o h/a, body being heavy and shes off balance. She reports thumbs b/l sore and neck popping. She does have h/o tick bite. She has f/u MS and MRI 2010 negative her optic nerve has been swollen in the past eye MD appt 02/2018. She also reports lack of focus increased stress at work  2. HTN has norvasc 2.5 mg qd but not taking  3. Iron def anemia not taking iron, vitamin D not taking vitamin D as well    Review of Systems  Constitutional: Positive for malaise/fatigue. Negative for weight loss.  HENT: Negative for hearing loss.   Eyes: Negative for blurred vision.  Respiratory: Negative for shortness of breath.   Cardiovascular: Negative for chest pain.  Gastrointestinal: Negative for heartburn.  Musculoskeletal: Negative for falls.  Skin: Negative for rash.  Neurological: Negative for headaches.  Psychiatric/Behavioral: Negative for depression.   Past Medical History:  Diagnosis Date  . Abnormal Pap smear   . Allergy   . Anemia   . Anxiety    no meds during pregnancy  . Asthma   . Chicken pox   . Chlamydia   . Depression   . Gastroesophageal reflux in pregancy    ocasional -  . Heart murmur   . Hypertension    PIH - No meds  . Hypokalemia    Potassium 2.7 August 02, 2011  . Hypokalemia   . Preeclampsia   . Tachycardia    Past Surgical History:  Procedure Laterality Date  . CESAREAN SECTION  2005, 2013   x 2  . WISDOM TOOTH EXTRACTION     Family History  Problem Relation Age of Onset  . Hypertension Mother   . Suicidality Father   . Alcohol abuse Father   . Heart disease Maternal Grandmother        valve replacement  . Hypertension Maternal Grandmother   . Supraventricular tachycardia Daughter   . Asthma Son   . Cancer Paternal Grandmother        breast   . Diabetes Paternal Grandmother   . Depression Paternal Grandfather   . Heart disease Paternal Grandfather   .  Other Neg Hx    Social History   Socioeconomic History  . Marital status: Married    Spouse name: Not on file  . Number of children: Not on file  . Years of education: Not on file  . Highest education level: Not on file  Occupational History  . Not on file  Social Needs  . Financial resource strain: Not on file  . Food insecurity:    Worry: Not on file    Inability: Not on file  . Transportation needs:    Medical: Not on file    Non-medical: Not on file  Tobacco Use  . Smoking status: Former Smoker    Packs/day: 0.25    Years: 1.50    Pack years: 0.37    Types: Cigarettes    Last attempt to quit: 1990    Years since quitting: 30.0  . Smokeless tobacco: Never Used  Substance and Sexual Activity  . Alcohol use: No    Alcohol/week: 0.0 standard drinks  . Drug use: No  . Sexual activity: Yes    Birth control/protection: None  Lifestyle  . Physical activity:    Days per week: Not on file    Minutes per session: Not on file  .  Stress: Not on file  Relationships  . Social connections:    Talks on phone: Not on file    Gets together: Not on file    Attends religious service: Not on file    Active member of club or organization: Not on file    Attends meetings of clubs or organizations: Not on file    Relationship status: Not on file  . Intimate partner violence:    Fear of current or ex partner: Not on file    Emotionally abused: Not on file    Physically abused: Not on file    Forced sexual activity: Not on file  Other Topics Concern  . Not on file  Social History Narrative   Married    2 kids son and daughter (75 and 9 y.o as of 08/2017)    HS ed    Financial advocate    Smoked socially as teenager    No guns, wears seat belt, safe in relationship    Current Meds  Medication Sig  . albuterol (PROAIR HFA) 108 (90 Base) MCG/ACT inhaler Inhale into the lungs every 6 (six) hours as needed for wheezing or shortness of breath.  Marland Kitchen amLODipine (NORVASC) 2.5 MG tablet  Take 1 tablet (2.5 mg total) by mouth daily. In am  . cetirizine (ZYRTEC) 10 MG tablet Take 10 mg by mouth daily.  . Cholecalciferol 50000 units capsule Take 1 capsule (50,000 Units total) by mouth once a week.  . escitalopram (LEXAPRO) 20 MG tablet Take 1 tablet (20 mg total) by mouth daily.  . ferrous sulfate 325 (65 FE) MG tablet Take 325 mg by mouth 2 (two) times daily with a meal.   No Known Allergies No results found for this or any previous visit (from the past 2160 hour(s)). Objective  Body mass index is 45.45 kg/m. Wt Readings from Last 3 Encounters:  02/04/18 281 lb 9.6 oz (127.7 kg)  02/04/18 281 lb (127.5 kg)  08/19/17 280 lb (127 kg)   Temp Readings from Last 3 Encounters:  02/04/18 98.4 F (36.9 C) (Oral)  02/04/18 98.8 F (37.1 C)  08/19/17 98.6 F (37 C) (Oral)   BP Readings from Last 3 Encounters:  02/04/18 140/88  02/04/18 (!) 130/100  08/19/17 122/88   Pulse Readings from Last 3 Encounters:  02/04/18 84  02/04/18 97  08/19/17 93    Physical Exam Vitals signs and nursing note reviewed.  Constitutional:      Appearance: Normal appearance. She is well-developed and well-groomed. She is obese.  HENT:     Head: Normocephalic and atraumatic.     Nose: Nose normal.     Mouth/Throat:     Mouth: Mucous membranes are moist.     Pharynx: Oropharynx is clear.  Eyes:     Conjunctiva/sclera: Conjunctivae normal.     Pupils: Pupils are equal, round, and reactive to light.  Cardiovascular:     Rate and Rhythm: Normal rate and regular rhythm.     Heart sounds: Normal heart sounds.  Pulmonary:     Effort: Pulmonary effort is normal.     Breath sounds: Normal breath sounds.  Skin:    General: Skin is warm and dry.  Neurological:     General: No focal deficit present.     Mental Status: She is alert and oriented to person, place, and time. Mental status is at baseline.     Gait: Gait normal.  Psychiatric:        Attention and Perception:  Attention and  perception normal.        Mood and Affect: Mood and affect normal.        Speech: Speech normal.        Behavior: Behavior normal. Behavior is cooperative.        Thought Content: Thought content normal.        Cognition and Memory: Cognition and memory normal.        Judgment: Judgment normal.     Assessment   1. Fatigue  2. HTN  3. Iron def anemia  4. Vit D def  5. HM Plan   1. Check labs r/o iron def, vit D, B12 def, autoimmune r/o lyme  Consider repeat MRI w and w/o contrast w/u MS  Consider sleep study  Consider neurology given h/a and dizziness  Reduced focus never had psych testing for ADD/ADHD 2. rec take norvasc 2.5 mg qhs  3. rec take iron 2x per day  4. rec take vitamin D  utd flu shot  5.  Flu shot utd ? Date Tdap thinks had 2015/2016  ? If had pna 23  Mammogram and pap 02/2018 - [facility]  Never had colonoscopy  Former smoker in [place] She reports she has had hep B titer and MMR titer with work  [facility]t 02/2018  [place] Provider: Dr. Olivia Mackie McLean-Scocuzza-Internal Medicine

## 2018-02-04 NOTE — Progress Notes (Signed)
Pre visit review using our clinic review tool, if applicable. No additional management support is needed unless otherwise documented below in the visit note. 

## 2018-02-04 NOTE — Patient Instructions (Signed)

## 2018-02-05 ENCOUNTER — Encounter: Payer: Self-pay | Admitting: Internal Medicine

## 2018-02-06 ENCOUNTER — Encounter: Payer: Self-pay | Admitting: Internal Medicine

## 2018-02-06 ENCOUNTER — Telehealth: Payer: Self-pay | Admitting: Emergency Medicine

## 2018-02-06 LAB — IRON,TIBC AND FERRITIN PANEL
%SAT: 9 % (calc) — ABNORMAL LOW (ref 16–45)
Ferritin: 27 ng/mL (ref 16–232)
Iron: 38 ug/dL — ABNORMAL LOW (ref 40–190)
TIBC: 401 mcg/dL (calc) (ref 250–450)

## 2018-02-06 LAB — ANA: ANA: NEGATIVE

## 2018-02-06 LAB — VITAMIN B12: Vitamin B-12: 431 pg/mL (ref 211–911)

## 2018-02-06 LAB — RHEUMATOID FACTOR: Rheumatoid fact SerPl-aCnc: <14 IU/mL (ref <14)

## 2018-02-06 LAB — CYCLIC CITRUL PEPTIDE ANTIBODY, IGG: Cyclic Citrullin Peptide Ab: <16 UNITS

## 2018-02-06 LAB — B. BURGDORFI ANTIBODIES: B burgdorferi Ab IgG+IgM: <0.9 index

## 2018-02-06 NOTE — Telephone Encounter (Signed)
Left message following up on visit with [facility] 

## 2018-02-07 ENCOUNTER — Other Ambulatory Visit: Payer: Self-pay | Admitting: Internal Medicine

## 2018-02-07 DIAGNOSIS — R748 Abnormal levels of other serum enzymes: Secondary | ICD-10-CM

## 2018-02-10 ENCOUNTER — Telehealth: Payer: Self-pay

## 2018-02-10 NOTE — Telephone Encounter (Signed)
-----   Message from Bevelyn Bucklesracy N McLean-Scocuzza, MD sent at 02/06/2018  4:46 PM EST ----- So far vitamin D normal would rec 2000-5000 IU D3 vitamin D3 daily over the counter  You have prediabetes  B12 normal  Liver enzymes elevated  -I want to order liver US to work this up are you agreeable?   Thyroid lab normal  Sed rate elevated this is nonspecific marker of inflammation  Anemia/blood cts improved keep taking iron  Autoimmune labs normal  No lyme disease

## 2018-02-19 ENCOUNTER — Ambulatory Visit: Payer: Commercial Managed Care - PPO | Admitting: Internal Medicine

## 2018-02-19 ENCOUNTER — Encounter: Payer: Self-pay | Admitting: Internal Medicine

## 2018-02-19 VITALS — BP 132/80 | HR 89 | Temp 98.6°F | Ht 66.0 in | Wt 280.0 lb

## 2018-02-19 DIAGNOSIS — T7840XD Allergy, unspecified, subsequent encounter: Secondary | ICD-10-CM

## 2018-02-19 DIAGNOSIS — J309 Allergic rhinitis, unspecified: Secondary | ICD-10-CM | POA: Insufficient documentation

## 2018-02-19 DIAGNOSIS — R41840 Attention and concentration deficit: Secondary | ICD-10-CM

## 2018-02-19 DIAGNOSIS — E611 Iron deficiency: Secondary | ICD-10-CM

## 2018-02-19 DIAGNOSIS — R5383 Other fatigue: Secondary | ICD-10-CM | POA: Diagnosis not present

## 2018-02-19 DIAGNOSIS — H04203 Unspecified epiphora, bilateral lacrimal glands: Secondary | ICD-10-CM

## 2018-02-19 DIAGNOSIS — G4733 Obstructive sleep apnea (adult) (pediatric): Secondary | ICD-10-CM | POA: Diagnosis not present

## 2018-02-19 DIAGNOSIS — E559 Vitamin D deficiency, unspecified: Secondary | ICD-10-CM

## 2018-02-19 DIAGNOSIS — R748 Abnormal levels of other serum enzymes: Secondary | ICD-10-CM | POA: Insufficient documentation

## 2018-02-19 DIAGNOSIS — I10 Essential (primary) hypertension: Secondary | ICD-10-CM | POA: Diagnosis not present

## 2018-02-19 MED ORDER — FLUTICASONE PROPIONATE 50 MCG/ACT NA SUSP: 1.0000 | Freq: Every day | NASAL | 11 refills | Status: DC

## 2018-02-19 MED ORDER — OLOPATADINE HCL 0.2 % OP SOLN: 1.0000 [drp] | Freq: Every day | OPHTHALMIC | 11 refills | Status: DC

## 2018-02-19 NOTE — Patient Instructions (Addendum)
Nonalcoholic Fatty Liver Disease Diet Nonalcoholic fatty liver disease is a condition that causes fat to accumulate in and around the liver. The disease makes it harder for the liver to work the way that it should. Following a healthy diet can help to keep nonalcoholic fatty liver disease under control. It can also help to prevent or improve conditions that are associated with the disease, such as heart disease, diabetes, high blood pressure, and abnormal cholesterol levels. Along with regular exercise, this diet:  Promotes weight loss.  Helps to control blood sugar levels.  Helps to improve the way that the body uses insulin. What do I need to know about this diet?  Use the glycemic index (GI) to plan your meals. The index tells you how quickly a food will raise your blood sugar. Choose low-GI foods. These foods take a longer time to raise blood sugar.  Keep track of how many calories you take in. Eating the right amount of calories will help you to achieve a healthy weight.  You may want to follow a Mediterranean diet. This diet includes a lot of vegetables, lean meats or fish, whole grains, fruits, and healthy oils and fats. What foods can I eat? Grains Whole grains, such as whole-wheat or whole-grain breads, crackers, tortillas, cereals, and pasta. Stone-ground whole wheat. Pumpernickel bread. Unsweetened oatmeal. Bulgur. Barley. Quinoa. Brown or wild rice. Corn or whole-wheat flour tortillas. Vegetables Lettuce. Spinach. Peas. Beets. Cauliflower. Cabbage. Broccoli. Carrots. Tomatoes. Squash. Eggplant. Herbs. Peppers. Onions. Cucumbers. Brussels sprouts. Yams and sweet potatoes. Beans. Lentils. Fruits Bananas. Apples. Oranges. Grapes. Papaya. Mango. Pomegranate. Kiwi. Grapefruit. Cherries. Meats and Other Protein Sources Seafood and shellfish. Lean meats. Poultry. Tofu. Dairy Low-fat or fat-free dairy products, such as yogurt, cottage cheese, and cheese. Beverages Water. Sugar-free  drinks. Tea. Coffee. Low-fat or skim milk. Milk alternatives, such as soy or almond milk. Real fruit juice. Condiments Mustard. Relish. Low-fat, low-sugar ketchup and barbecue sauce. Low-fat or fat-free mayonnaise. Sweets and Desserts Sugar-free sweets. Fats and Oils Avocado. Canola or olive oil. Nuts and nut butters. Seeds. The items listed above may not be a complete list of recommended foods or beverages. Contact your dietitian for more options. What foods are not recommended? Palm oil and coconut oil. Processed foods. Fried foods. Sweetened drinks, such as sweet tea, milkshakes, snow cones, iced sweet drinks, and sodas. Alcohol. Sweets. Foods that contain a lot of salt or sodium. The items listed above may not be a complete list of foods and beverages to avoid. Contact your dietitian for more information. This information is not intended to replace advice given to you by your health care provider. Make sure you discuss any questions you have with your health care provider. Document Released: 05/18/2014 Document Revised: 06/09/2015 Document Reviewed: 01/26/2014 Elsevier Interactive Patient Education  2019 [employer]  Fatty Liver Disease  Fatty liver disease occurs when too much fat has built up in your liver cells. Fatty liver disease is also called hepatic steatosis or steatohepatitis. The liver removes harmful substances from your bloodstream and produces fluids that your body needs. It also helps your body use and store energy from the food you eat. In many cases, fatty liver disease does not cause symptoms or problems. It is often diagnosed when tests are being done for other reasons. However, over time, fatty liver can cause inflammation that may lead to more serious liver problems, such as scarring of the liver (cirrhosis) and liver failure. Fatty liver is associated with insulin resistance, increased body   fat, high blood pressure (hypertension), and high cholesterol. These are  features of metabolic syndrome and increase your risk for stroke, diabetes, and heart disease. What are the causes? This condition may be caused by:  Drinking too much alcohol.  Poor nutrition.  Obesity.  Cushing's syndrome.  Diabetes.  High cholesterol.  Certain drugs.  Poisons.  Some viral infections.  Pregnancy. What increases the risk? You are more likely to develop this condition if you:  Abuse alcohol.  Are overweight.  Have diabetes.  Have hepatitis.  Have a high triglyceride level.  Are pregnant. What are the signs or symptoms? Fatty liver disease often does not cause symptoms. If symptoms do develop, they can include:  Fatigue.  Weakness.  Weight loss.  Confusion.  Abdominal pain.  Nausea and vomiting.  Yellowing of your skin and the white parts of your eyes (jaundice).  Itchy skin. How is this diagnosed? This condition may be diagnosed by:  A physical exam and medical history.  Blood tests.  Imaging tests, such as an ultrasound, CT scan, or MRI.  A liver biopsy. A small sample of liver tissue is removed using a needle. The sample is then looked at under a microscope. How is this treated? Fatty liver disease is often caused by other health conditions. Treatment for fatty liver may involve medicines and lifestyle changes to manage conditions such as:  Alcoholism.  High cholesterol.  Diabetes.  Being overweight or obese. Follow these instructions at home:   Do not drink alcohol. If you have trouble quitting, ask your health care provider how to safely quit with the help of medicine or a supervised program. This is important to keep your condition from getting worse.  Eat a healthy diet as told by your health care provider. Ask your health care provider about working with a diet and nutrition specialist (dietitian) to develop an eating plan.  Exercise regularly. This can help you lose weight and control your cholesterol and  diabetes. Talk to your health care provider about an exercise plan and which activities are best for you.  Take over-the-counter and prescription medicines only as told by your health care provider.  Keep all follow-up visits as told by your health care provider. This is important. Contact a health care provider if: You have trouble controlling your:  Blood sugar. This is especially important if you have diabetes.  Cholesterol.  Drinking of alcohol. Get help right away if:  You have abdominal pain.  You have jaundice.  You have nausea and vomiting.  You vomit blood or material that looks like coffee grounds.  You have stools that are black, tar-like, or bloody. Summary  Fatty liver disease develops when too much fat builds up in the cells of your liver.  Fatty liver disease often causes no symptoms or problems. However, over time, fatty liver can cause inflammation that may lead to more serious liver problems, such as scarring of the liver (cirrhosis).  You are more likely to develop this condition if you abuse alcohol, are pregnant, are overweight, have diabetes, have hepatitis, or have high triglyceride levels.  Contact your health care provider if you have trouble controlling your weight, blood sugar, cholesterol, or drinking of alcohol. This information is not intended to replace advice given to you by your health care provider. Make sure you discuss any questions you have with your health care provider. Document Released: 02/16/2005 Document Revised: 10/10/2016 Document Reviewed: 10/10/2016 Elsevier Interactive Patient Education  2019 [employer]  Olopatadine eye  solution What is this medicine? OLOPATADINE (oh loe pa TA deen) drops are used in the eye to treat symptoms caused by allergies. This medicine may be used for other purposes; ask your health care provider or pharmacist if you have questions. COMMON BRAND NAME(S): Pataday, Patanol, Pazeo What should I tell my  health care provider before I take this medicine? They need to know if you have any of these conditions: -wear contact lenses -an unusual or allergic reaction to olopatadine, other medicines, foods, dyes, or preservatives -pregnant or trying to get pregnant -breast-feeding How should I use this medicine? This medicine is only for use in the eye. Do not take by mouth. Follow the directions on the prescription label. Wash hands before and after use. Tilt the head back slightly and pull down the lower lid with the index finger to form a pouch. Try not to touch the tip of the dropper to your eye, fingertips, or any other surface. Place the prescribed number of drops into the pouch. Close the eye gently to spread the drops. Your vision may blur for a few minutes. Use your doses at regular intervals. Do not use your medicine more often than directed. Talk to your pediatrician regarding the use of this medicine in children. While this drug may be prescribed for children as young as 37 years of age for selected conditions, precautions do apply. Overdosage: If you think you have taken too much of this medicine contact a poison control center or emergency room at once. NOTE: This medicine is only for you. Do not share this medicine with others. What if I miss a dose? If you miss a dose, use it as soon as you can. If it is almost time for your next dose, use only that dose. Do not use double or extra doses. What may interact with this medicine? Interactions are not expected. Do not use any other eye products without telling your doctor or health care professional. This list may not describe all possible interactions. Give your health care provider a list of all the medicines, herbs, non-prescription drugs, or dietary supplements you use. Also tell them if you smoke, drink alcohol, or use illegal drugs. Some items may interact with your medicine. What should I watch for while using this medicine? Tell your  doctor or healthcare professional if your symptoms do not start to get better or if they get worse. Stop using this medicine if your eyes get swollen, painful, or have a discharge, and see your doctor or health care professional as soon as you can. Do not wear contact lenses if your eyes are red. This medicine should not be used to treat irritation that is caused by contact lenses. Remove soft contact lenses before using this medicine. Contact lenses may be inserted 10 minutes after putting the medicine in the eye. What side effects may I notice from receiving this medicine? Side effects that you should report to your doctor or health care professional as soon as possible: -allergic reactions like skin rash, itching or hives, swelling of the face, lips, or tongue -eye pain, swelling, or increased redness Side effects that usually do not require medical attention (report to your doctor or health care professional if they continue or are bothersome): -burning, discomfort, stinging, or tearing immediately after use -dry eyes -headache -runny or stuffy nose This list may not describe all possible side effects. Call your doctor for medical advice about side effects. You may report side effects to FDA at 1-800-FDA-1088.  Where should I keep my medicine? Keep out of reach of children. Store between 4 and 25 degrees C (39 and 77 degrees F). Keep container tightly closed when not in use. Protect from light. Throw away any unused medicine after the expiration date. NOTE: This sheet is a summary. It may not cover all possible information. If you have questions about this medicine, talk to your doctor, pharmacist, or health care provider.  2019 Elsevier/Gold Standard (2015-08-31 15:45:49)  Montelukast oral tablets What is this medicine? MONTELUKAST (mon te LOO kast) is used to prevent and treat the symptoms of asthma. It is also used to treat allergies. Do not use for an acute asthma attack. This medicine  may be used for other purposes; ask your health care provider or pharmacist if you have questions. COMMON BRAND NAME(S): Singulair What should I tell my health care provider before I take this medicine? They need to know if you have any of these conditions: -liver disease -an unusual or allergic reaction to montelukast, other medicines, foods, dyes, or preservatives -pregnant or trying to get pregnant -breast-feeding How should I use this medicine? This medicine should be given by mouth. Follow the directions on the prescription label. Take this medicine at the same time every day. You may take this medicine with or without meals. Do not chew the tablets. Do not stop taking your medicine unless your doctor tells you to. Talk to your pediatrician regarding the use of this medicine in children. Special care may be needed. While this drug may be prescribed for children as young as 43 years of age for selected conditions, precautions do apply. Overdosage: If you think you have taken too much of this medicine contact a poison control center or emergency room at once. NOTE: This medicine is only for you. Do not share this medicine with others. What if I miss a dose? If you miss a dose, take it as soon as you can. If it is almost time for your next dose, take only that dose. Do not take double or extra doses. What may interact with this medicine? -anti-infectives like rifampin and rifabutin -medicines for seizures like phenytoin, phenobarbital, and carbamazepine This list may not describe all possible interactions. Give your health care provider a list of all the medicines, herbs, non-prescription drugs, or dietary supplements you use. Also tell them if you smoke, drink alcohol, or use illegal drugs. Some items may interact with your medicine. What should I watch for while using this medicine? Visit your doctor or health care professional for regular checks on your progress. Tell your doctor or health  care professional if your allergy or asthma symptoms do not improve. Take your medicine even when you do not have symptoms. Do not stop taking any of your medicine(s) unless your doctor tells you to. If you have asthma, talk to your doctor about what to do in an acute asthma attack. Always have your inhaled rescue medicine for asthma attacks with you. Patients and their families should watch for new or worsening thoughts of suicide or depression. Also watch for sudden changes in feelings such as feeling anxious, agitated, panicky, irritable, hostile, aggressive, impulsive, severely restless, overly excited and hyperactive, or not being able to sleep. Any worsening of mood or thoughts of suicide or dying should be reported to your health care professional right away. What side effects may I notice from receiving this medicine? Side effects that you should report to your doctor or health care professional as soon as  possible: -allergic reactions like skin rash or hives, or swelling of the face, lips, or tongue -breathing problems -changes in emotions or moods -confusion -depressed mood -fever or infection -hallucinations -joint pain -painful lumps under the skin -pain, tingling, numbness in the hands or feet -redness, blistering, peeling, or loosening of the skin, including inside the mouth -restlessness -seizures -sleep walking -signs and symptoms of infection like fever; chills; cough; sore throat; flu-like illness -signs and symptoms of liver injury like dark yellow or brown urine; general ill feeling or flu-like symptoms; light-colored stools; loss of appetite; nausea; right upper belly pain; unusually weak or tired; yellowing of the eyes or skin -sinus pain or swelling -stuttering -suicidal thoughts or other mood changes -tremors -trouble sleeping -uncontrolled muscle movements -unusual bleeding or bruising -vivid or bad dreams Side effects that usually do not require medical  attention (report to your doctor or health care professional if they continue or are bothersome): -dizziness -drowsiness -headache -runny nose -stomach upset -tiredness This list may not describe all possible side effects. Call your doctor for medical advice about side effects. You may report side effects to FDA at 1-800-FDA-1088. Where should I keep my medicine? Keep out of the reach of children. Store at room temperature between 15 and 30 degrees C (59 and 86 degrees F). Protect from light and moisture. Keep this medicine in the original bottle. Throw away any unused medicine after the expiration date. NOTE: This sheet is a summary. It may not cover all possible information. If you have questions about this medicine, talk to your doctor, pharmacist, or health care provider.  2019 Elsevier/[employer] Standard (2017-08-27 13:51:04)

## 2018-02-19 NOTE — Progress Notes (Signed)
No chief complaint on file.  F/u pt feeling better energy wise but still has daytime sleepiness and tiredness mostly when doing chart review and reading at work eye exam 03/2018   C/o allergies and watery eyes allergic to cats/mold/dust using zyrtec not using flonase c/o ear right congestion and sensation something running and ear pain 4/10 tried Sudafed x 2 doses on 1 day and helped   HTN improved on norvasc 2.5 mg qd taking medication now   Elevated liver enzymes [place] ab pending   Review of Systems  Constitutional: Negative for malaise/fatigue and weight loss.       Daytime fatigue   HENT: Positive for ear pain. Negative for hearing loss.   Eyes:       Watery eyes   Respiratory: Negative for shortness of breath.   Cardiovascular: Negative for chest pain.  Gastrointestinal: Negative for abdominal pain.  Musculoskeletal: Negative for falls.  Skin: Negative for rash.  Neurological: Negative for headaches.  Psychiatric/Behavioral: Negative for depression.   Past Medical History:  Diagnosis Date  . Abnormal Pap smear   . Allergy   . Anemia   . Anxiety    no meds during pregnancy  . Asthma   . Chicken pox   . Chlamydia   . Depression   . Gastroesophageal reflux in pregancy    ocasional -  . Heart murmur   . Hypertension    PIH - No meds  . Hypokalemia    Potassium 2.7 August 02, 2011  . Hypokalemia   . Preeclampsia   . Tachycardia    Past Surgical History:  Procedure Laterality Date  . CESAREAN SECTION  2005, 2013   x 2  . WISDOM TOOTH EXTRACTION     Family History  Problem Relation Age of Onset  . Hypertension Mother   . Suicidality Father   . Alcohol abuse Father   . Heart disease Maternal Grandmother        valve replacement  . Hypertension Maternal Grandmother   . Supraventricular tachycardia Daughter   . Asthma Son   . Cancer Paternal Grandmother        breast   . Diabetes Paternal Grandmother   . Depression Paternal Grandfather   . Heart disease  Paternal Grandfather   . Other Neg Hx    Social History   Socioeconomic History  . Marital status: Married    Spouse name: Not on file  . Number of children: Not on file  . Years of education: Not on file  . Highest education level: Not on file  Occupational History  . Not on file  Social Needs  . Financial resource strain: Not on file  . Food insecurity:    Worry: Not on file    Inability: Not on file  . Transportation needs:    Medical: Not on file    Non-medical: Not on file  Tobacco Use  . Smoking status: Former Smoker    Packs/day: 0.25    Years: 1.50    Pack years: 0.37    Types: Cigarettes    Last attempt to quit: 1990    Years since quitting: 30.1  . Smokeless tobacco: Never Used  Substance and Sexual Activity  . Alcohol use: No    Alcohol/week: 0.0 standard drinks  . Drug use: No  . Sexual activity: Yes    Birth control/protection: None  Lifestyle  . Physical activity:    Days per week: Not on file    Minutes per session:  Not on file  . Stress: Not on file  Relationships  . Social connections:    Talks on phone: Not on file    Gets together: Not on file    Attends religious service: Not on file    Active member of club or organization: Not on file    Attends meetings of clubs or organizations: Not on file    Relationship status: Not on file  . Intimate partner violence:    Fear of current or ex partner: Not on file    Emotionally abused: Not on file    Physically abused: Not on file    Forced sexual activity: Not on file  Other Topics Concern  . Not on file  Social History Narrative   Married    2 kids son and daughter (27 and 80 y.o as of 08/2017)    HS ed    Financial advocate    Smoked socially as teenager    No guns, wears seat belt, safe in relationship    Current Meds  Medication Sig  . albuterol (PROAIR HFA) 108 (90 Base) MCG/ACT inhaler Inhale into the lungs every 6 (six) hours as needed for wheezing or shortness of breath.  Marland Kitchen  amLODipine (NORVASC) 2.5 MG tablet Take 1 tablet (2.5 mg total) by mouth daily. In am  . cetirizine (ZYRTEC) 10 MG tablet Take 10 mg by mouth daily.  . Cholecalciferol 50000 units capsule Take 1 capsule (50,000 Units total) by mouth once a week.  . escitalopram (LEXAPRO) 20 MG tablet Take 1 tablet (20 mg total) by mouth daily.  . ferrous sulfate 325 (65 FE) MG tablet Take 325 mg by mouth 2 (two) times daily with a meal.   No Known Allergies Recent Results (from the past 2160 hour(s))  Comprehensive metabolic panel     Status: Abnormal   Collection Time: 02/04/18  1:25 PM  Result Value Ref Range   Sodium 139 135 - 145 mEq/L   Potassium 4.0 3.5 - 5.1 mEq/L   Chloride 104 96 - 112 mEq/L   CO2 26 19 - 32 mEq/L   Glucose, Bld 81 70 - 99 mg/dL   BUN 9 6 - 23 mg/dL   Creatinine, Ser 0.57 0.40 - 1.20 mg/dL   Total Bilirubin 0.3 0.2 - 1.2 mg/dL   Alkaline Phosphatase 59 39 - 117 U/L   AST 44 (H) 0 - 37 U/L   ALT 50 (H) 0 - 35 U/L   Total Protein 7.9 6.0 - 8.3 g/dL   Albumin 4.1 3.5 - 5.2 g/dL   Calcium 9.7 8.4 - 10.5 mg/dL   GFR 116.26 >60.00 mL/min  CBC with Differential/Platelet     Status: Abnormal   Collection Time: 02/04/18  1:25 PM  Result Value Ref Range   WBC 7.2 4.0 - 10.5 K/uL   RBC 5.69 (H) 3.87 - 5.11 Mil/uL   Hemoglobin 14.7 12.0 - 15.0 g/dL   HCT 45.4 36.0 - 46.0 %   MCV 79.9 78.0 - 100.0 fl   MCHC 32.3 30.0 - 36.0 g/dL   RDW 15.4 11.5 - 15.5 %   Platelets 288.0 150.0 - 400.0 K/uL   Neutrophils Relative % 50.6 43.0 - 77.0 %   Lymphocytes Relative 35.1 12.0 - 46.0 %   Monocytes Relative 6.9 3.0 - 12.0 %   Eosinophils Relative 6.6 (H) 0.0 - 5.0 %   Basophils Relative 0.8 0.0 - 3.0 %   Neutro Abs 3.7 1.4 - 7.7 K/uL  Lymphs Abs 2.5 0.7 - 4.0 K/uL   Monocytes Absolute 0.5 0.1 - 1.0 K/uL   Eosinophils Absolute 0.5 0.0 - 0.7 K/uL   Basophils Absolute 0.1 0.0 - 0.1 K/uL  Hemoglobin A1c     Status: None   Collection Time: 02/04/18  1:25 PM  Result Value Ref Range   Hgb  A1c MFr Bld 5.9 4.6 - 6.5 %    Comment: Glycemic Control Guidelines for People with Diabetes:Non Diabetic:  <6%Goal of Therapy: <7%Additional Action Suggested:  >8%   B. burgdorfi antibodies     Status: None   Collection Time: 02/04/18  1:25 PM  Result Value Ref Range   B burgdorferi Ab IgG+IgM <0.90 index    Comment:                    Index                Interpretation                    -----                --------------                    < 0.90               Negative                    0.90-1.09            Equivocal                    > 1.09               Positive . As recommended by the Food and Drug Administration  (FDA), all samples with positive or equivocal  results in a Borrelia burgdorferi antibody screen will be tested using a blot method. Positive or  equivocal screening test results should not be  interpreted as truly positive until verified as such  using a supplemental assay (e.g., B. burgdorferi blot). . The screening test and/or blot for B. burgdorferi  antibodies may be falsely negative in early stages of Lyme disease, including the period when erythema  migrans is apparent. .   TSH     Status: None   Collection Time: 02/04/18  1:25 PM  Result Value Ref Range   TSH 1.12 0.35 - 4.50 uIU/mL  Iron, TIBC and Ferritin Panel     Status: Abnormal   Collection Time: 02/04/18  1:25 PM  Result Value Ref Range   Iron 38 (L) 40 - 190 mcg/dL   TIBC 401 250 - 450 mcg/dL (calc)   %SAT 9 (L) 16 - 45 % (calc)   Ferritin 27 16 - 232 ng/mL  Antinuclear Antib (ANA)     Status: None   Collection Time: 02/04/18  1:25 PM  Result Value Ref Range   Anti Nuclear Antibody(ANA) NEGATIVE NEGATIVE    Comment: ANA IFA is a first line screen for detecting the presence of up to approximately 150 autoantibodies in various autoimmune diseases. A negative ANA IFA result suggests an ANA-associated autoimmune disease is not present at this time, but is not definitive. If there is high  clinical suspicion for Sjogren's syndrome, testing for anti-SS-A/Ro antibody should be considered. Anti-Jo-1 antibody should be considered for clinically suspected inflammatory myopathies. . AC-0: Negative . International Consensus on ANA Patterns (https://www.hernandez-brewer.com/) . For additional information, please  refer to http://education.[facility].com/faq/FAQ177 (This link is being provided for informational/ educational purposes only.) .   Vitamin D (25 hydroxy)     Status: None   Collection Time: 02/04/18  1:25 PM  Result Value Ref Range   VITD 46.56 30.00 - 100.00 ng/mL  Rheumatoid Factor     Status: None   Collection Time: 02/04/18  1:25 PM  Result Value Ref Range   Rhuematoid fact SerPl-aCnc <14 <14 IU/mL  Sedimentation rate     Status: Abnormal   Collection Time: 02/04/18  1:25 PM  Result Value Ref Range   Sed Rate 40 (H) 0 - 20 mm/hr  C-reactive protein     Status: None   Collection Time: 02/04/18  1:25 PM  Result Value Ref Range   CRP 0.7 0.5 - 78.6 mg/dL  Cyclic citrul peptide antibody, IgG ([facility])     Status: None   Collection Time: 02/04/18  1:25 PM  Result Value Ref Range   Cyclic Citrullin Peptide Ab <16 UNITS    Comment: Reference Range Negative:            <20 Weak Positive:       20-39 Moderate Positive:   40-59 Strong Positive:     >59 .   B12     Status: None   Collection Time: 02/04/18  1:25 PM  Result Value Ref Range   Vitamin B-12 431 211 - 911 pg/mL   Objective  Body mass index is 45.19 kg/m. Wt Readings from Last 3 Encounters:  02/19/18 280 lb (127 kg)  02/04/18 281 lb 9.6 oz (127.7 kg)  02/04/18 281 lb (127.5 kg)   Temp Readings from Last 3 Encounters:  02/19/18 98.6 F (37 C) (Oral)  02/04/18 98.4 F (36.9 C) (Oral)  02/04/18 98.8 F (37.1 C)   BP Readings from Last 3 Encounters:  02/19/18 132/80  02/04/18 140/88  02/04/18 (!) 130/100   Pulse Readings from Last 3 Encounters:  02/19/18 89  02/04/18 84   02/04/18 97    Physical Exam Vitals signs and nursing note reviewed.  Constitutional:      Appearance: Normal appearance. She is well-developed and well-groomed.  HENT:     Head: Normocephalic and atraumatic.     Ears:     Comments: Mild fluid in b/l ears and sx's of ETD      Nose: Nose normal.     Mouth/Throat:     Mouth: Mucous membranes are moist.     Pharynx: Oropharynx is clear.  Eyes:     Conjunctiva/sclera: Conjunctivae normal.     Pupils: Pupils are equal, round, and reactive to light.  Cardiovascular:     Rate and Rhythm: Normal rate and regular rhythm.     Heart sounds: Normal heart sounds. No murmur.  Pulmonary:     Effort: Pulmonary effort is normal.     Breath sounds: Normal breath sounds.  Skin:    General: Skin is warm and dry.  Neurological:     General: No focal deficit present.     Mental Status: She is alert and oriented to person, place, and time. Mental status is at baseline.     Gait: Gait normal.  Psychiatric:        Attention and Perception: Attention and perception normal.        Mood and Affect: Mood and affect normal.        Speech: Speech normal.        Behavior: Behavior normal. Behavior is cooperative.  Thought Content: Thought content normal.        Cognition and Memory: Cognition and memory normal.        Judgment: Judgment normal.     Assessment   1.allergies to mold/dust/cats and watery eyes  2. HTN improved  3. Daytime sleepiness/fatigue r/o OSA and lack of focus  4. Elevated liver enzymes  5. HM  Plan   1. Zyrtec, flonase add pataday  Consider singulair in future  2. Cont norvasc 2.5 mg qd  Monitor BP  3. Referred to pulm for home sleep study  Consider psych testing ADD/ADHD in future if negative  4. [place] sch 02/27/2018  5.  Flu shot utd ? Date Tdap thinks had 2015/2016  ? If had pna 23  Mammogram and pap 04/2018 -[facility] Iron def anemia improved  rec D3 2000-5000 IU dailly   Never had  colonoscopy  Former smoker in [place] She reports she has had hep B titer and MMR titer with work  Chief Operating Officer eye appt 02/2018  [place] Provider: Dr. Olivia Mackie McLean-Scocuzza-Internal Medicine

## 2018-02-19 NOTE — Progress Notes (Signed)
Pre visit review using our clinic review tool, if applicable. No additional management support is needed unless otherwise documented below in the visit note. 

## 2018-02-26 ENCOUNTER — Ambulatory Visit
Admission: RE | Admit: 2018-02-26 | Discharge: 2018-02-26 | Disposition: A | Discharge status: Home / Self Care | Payer: 59 | Source: Ambulatory Visit | Attending: Internal Medicine | Admitting: Internal Medicine

## 2018-02-26 DIAGNOSIS — K760 Fatty (change of) liver, not elsewhere classified: Secondary | ICD-10-CM | POA: Diagnosis not present

## 2018-02-26 DIAGNOSIS — R748 Abnormal levels of other serum enzymes: Secondary | ICD-10-CM | POA: Insufficient documentation

## 2018-02-26 DIAGNOSIS — K8020 Calculus of gallbladder without cholecystitis without obstruction: Secondary | ICD-10-CM | POA: Diagnosis not present

## 2018-03-04 ENCOUNTER — Ambulatory Visit (INDEPENDENT_AMBULATORY_CARE_PROVIDER_SITE_OTHER): Payer: 59 | Admitting: Internal Medicine

## 2018-03-04 ENCOUNTER — Encounter: Payer: Self-pay | Admitting: Internal Medicine

## 2018-03-04 VITALS — BP 158/90 | HR 97 | Ht 66.0 in

## 2018-03-04 DIAGNOSIS — G4719 Other hypersomnia: Secondary | ICD-10-CM | POA: Diagnosis not present

## 2018-03-04 NOTE — Progress Notes (Signed)
[facility] Pulmonary Medicine Consultation      Assessment and Plan:  Excessive daytime sleepiness. --Symptoms and signs of OSA. Some jaw clicking occasionally.  --Will send for sleep study.   Obesity, depression, right tonsillar hypertrophy.  --OSA can be related to above conditions therefore treatment of OSA is an important part of their management.    Date: 03/04/2018  MRN# [id number] Kimberly [name] [birth year]  Referring Physician: for sleep issues.   Kimberly [name] is a 43 y.o. old female seen in consultation for chief complaint of:    Chief Complaint  Patient presents with  . Consult    Referred by Dr. 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 On the weekends she feels better as she sleeps in but on the weekdays she wakes earlier and feels irritable and tired.  This has been going on for about 6 months and has been progressing over the past 2 months. She snores at night.   Denies sleep paralysis, no sleep walking, no cataplexy. Denies jaw pain, but sometimes her jaw pops when she opens it.  She was seen by ENT due to allergies and drainage, was told that her tonsil was enlarged.   No family members with OSA>    PMHX:   Past Medical History:  Diagnosis Date  . Abnormal Pap smear   . Allergy   . Anemia   . Anxiety    no meds during pregnancy  . Asthma   . Chicken pox   . Chlamydia   . Depression   . Gastroesophageal reflux in pregancy    ocasional -  . Heart murmur   . Hypertension    PIH - No meds  . Hypokalemia    Potassium 2.7 August 02, 2011  . Hypokalemia   . Preeclampsia   . Tachycardia    Surgical Hx:  Past Surgical History:  Procedure Laterality Date  . CESAREAN SECTION  2005, 2013   x 2  . WISDOM TOOTH EXTRACTION     Family Hx:  Family History  Problem Relation Age of Onset  . Hypertension Mother   . Suicidality Father     . Alcohol abuse Father   . Heart disease Maternal Grandmother        valve replacement  . Hypertension Maternal Grandmother   . Supraventricular tachycardia Daughter   . Asthma Son   . Cancer Paternal Grandmother        breast   . Diabetes Paternal Grandmother   . Depression Paternal Grandfather   . Heart disease Paternal Grandfather   . Other Neg Hx    Social Hx:   Social History   Tobacco Use  . Smoking status: Former Smoker    Packs/day: 0.25    Years: 1.50    Pack years: 0.37    Types: Cigarettes    Last attempt to quit: 1990    Years since quitting: 30.1  . Smokeless tobacco: Never Used  Substance Use Topics  . Alcohol use: No    Alcohol/week: 0.0 standard drinks  . Drug use: No   Medication:    Current Outpatient Medications:  .  albuterol (PROAIR HFA) 108 (90 Base) MCG/ACT inhaler, Inhale into the lungs every 6 (six) hours as needed for wheezing or shortness of breath., Disp: , Rfl:  .  amLODipine (NORVASC) 2.5 MG tablet, Take 1 tablet (2.5  mg total) by mouth daily. In am, Disp: 90 tablet, Rfl: 3 .  cetirizine (ZYRTEC) 10 MG tablet, Take 10 mg by mouth daily., Disp: , Rfl:  .  Cholecalciferol 50000 units capsule, Take 1 capsule (50,000 Units total) by mouth once a week., Disp: 13 capsule, Rfl: 1 .  escitalopram (LEXAPRO) 20 MG tablet, Take 1 tablet (20 mg total) by mouth daily., Disp: 90 tablet, Rfl: 1 .  ferrous sulfate 325 (65 FE) MG tablet, Take 325 mg by mouth 2 (two) times daily with a meal., Disp: , Rfl:  .  fluticasone (FLONASE) 50 MCG/ACT nasal spray, Place 1-2 sprays into both nostrils daily. Max 2 sprays each nostril, Disp: 16 g, Rfl: 11 .  Olopatadine HCl 0.2 % SOLN, Apply 1 drop to eye daily. Each eye, Disp: 1 Bottle, Rfl: 11   Allergies:  Patient has no known allergies.  Review of Systems: Gen:  Denies  fever, sweats, chills HEENT: Denies blurred vision, double vision. bleeds, sore throat Cvc:  No dizziness, chest pain. Resp:   Denies cough or  sputum production, shortness of breath Gi: Denies swallowing difficulty, stomach pain. Gu:  Denies bladder incontinence, burning urine Ext:   No Joint pain, stiffness. Skin: No skin rash,  hives  Endoc:  No polyuria, polydipsia. Psych: No depression, insomnia. Other:  All other systems were reviewed with the patient and were negative other that what is mentioned in the HPI.   Physical Examination:   VS: BP (!) 158/90 (BP Location: Left Arm, Cuff Size: Large)   Pulse 97   Ht 5\' 6"  (1.676 m)   SpO2 99%   BMI 45.19 kg/m   General Appearance: No distress  Neuro:without focal findings,  speech normal,  HEENT: PERRLA, EOM intact.  Right tonsil enlarged. Malimpatti 3.  Pulmonary: normal breath sounds, No wheezing.  CardiovascularNormal S1,S2.  No m/r/g.   Abdomen: Benign, Soft, non-tender. Renal:  No costovertebral tenderness  GU:  No performed at this time. Endoc: No evident thyromegaly, no signs of acromegaly. Skin:   warm, no rashes, no ecchymosis  Extremities: normal, no cyanosis, clubbing.  Other findings:    LABORATORY PANEL:   CBC No results for input(s): WBC, HGB, HCT, PLT in the last 168 hours. ------------------------------------------------------------------------------------------------------------------  Chemistries  No results for input(s): NA, K, CL, CO2, GLUCOSE, BUN, CREATININE, CALCIUM, MG, AST, ALT, ALKPHOS, BILITOT in the last 168 hours.  Invalid input(s): GFRCGP ------------------------------------------------------------------------------------------------------------------  Cardiac Enzymes No results for input(s): TROPONINI in the last 168 hours. ------------------------------------------------------------  RADIOLOGY:  No results found.     Thank  you for the consultation and for allowing [facility], [facility] to assist in the care of your patient. Our recommendations are noted above.  Please contact us if we can be of further  service.   Wells Guiles, M.D., F.C.C.P.  Board Certified in Internal Medicine, Pulmonary Medicine, Critical Care Medicine, and Sleep Medicine.   Pulmonary and Critical Care Office Number: [phone]   03/04/2018

## 2018-03-04 NOTE — Patient Instructions (Signed)

## 2018-03-17 DIAGNOSIS — G4733 Obstructive sleep apnea (adult) (pediatric): Secondary | ICD-10-CM | POA: Diagnosis not present

## 2018-03-19 ENCOUNTER — Telehealth: Payer: Self-pay | Admitting: Internal Medicine

## 2018-03-19 DIAGNOSIS — G4733 Obstructive sleep apnea (adult) (pediatric): Secondary | ICD-10-CM | POA: Diagnosis not present

## 2018-03-19 DIAGNOSIS — G4719 Other hypersomnia: Secondary | ICD-10-CM

## 2018-03-19 NOTE — Telephone Encounter (Signed)
HST performed on 03/16/18 confirmed moderate OSA with AHI of 19. Recommend auto cpap 5-20cm h2O.  Pt is aware of results and voiced her understanding. Pt wishes to proceed with cpap therapy. Order has been placed.  Pt has pending OV with DR on 06/12/18. Nothing further is needed.

## 2018-03-26 DIAGNOSIS — G4733 Obstructive sleep apnea (adult) (pediatric): Secondary | ICD-10-CM | POA: Diagnosis not present

## 2018-04-09 ENCOUNTER — Telehealth: Payer: 59 | Admitting: Nurse Practitioner

## 2018-04-09 ENCOUNTER — Telehealth: Payer: Self-pay | Admitting: Physician Assistant

## 2018-04-09 DIAGNOSIS — Z20828 Contact with and (suspected) exposure to other viral communicable diseases: Secondary | ICD-10-CM

## 2018-04-09 MED ORDER — OSELTAMIVIR PHOSPHATE 75 MG PO CAPS: 75.0000 mg | ORAL_CAPSULE | Freq: Two times a day (BID) | ORAL | 0 refills | Status: DC

## 2018-04-09 NOTE — Telephone Encounter (Signed)
Patient scheduled appointment for [facility] using [facility].  Chief complaint of: Body aches  Called patient for further evaluation of possibility respiratory disease is due to Covid19.  Called patient on mobile phone: [phone]  Patient reports symptoms of: 1 day of body aches, malaise, and sore/scratchy throat Fever: No Symptoms began: Today Has taken over the counter: None  Patient concerned about influenza. Husband and son seen at [facility] last week, both tested positive on rapid flu test (strain A).   Denies chest discomfort, shortness of breath, difficulty breathing, altered thinking, cyanosis. Denies cough.  Patient with complications of mild asthma (has albuterol inhaler, no daily maintenance inhaler), obesity with BMI >40, and prediabetes (last A1C on 02/04/18 was 5.9).  Denies age of 43 years old, chronic lung disease or moderate to severe asthma, heart disease, kidney disease, or liver disease. Denies immunocompromising disorder, status post organ transplant, or use of immunosuppressant medication including daily prednisone.  Denies known Covid19 exposure; however, works in Teacher, music.  Denies recent travel internationally or to where Covid19 is widespread, including cruise ship travel.  Discussed with patient treatment options. [facility] currently not performing rapid flu tests. At this time, believe best if patient completes E-Visit, has [employer] health insurance plan. Patient may be prescribed antiviral if appropriate and given work excuse note. Advised patient still contact [employer] to discuss low risk/suspicion for [phone] and return to work plan.  Advised patient call [facility] back with any issues or concerns. Patient agreed with plan.

## 2018-04-09 NOTE — Progress Notes (Signed)
E visit for Flu like symptoms   We are sorry that you are not feeling well.  Here is how we plan to help! Based on what you have shared with me it looks like you may have possible exposure to a virus that causes influenza.  Influenza or "the flu" is   an infection caused by a respiratory virus. The flu virus is highly contagious and persons who did not receive their yearly flu vaccination may "catch" the flu from close contact.  We have anti-viral medications to treat the viruses that cause this infection. They are not a "cure" and only shorten the course of the infection. These prescriptions are most effective when they are given within the first 2 days of "flu" symptoms. Antiviral medication are indicated if you have a high risk of complications from the flu. You should  also consider an antiviral medication if you are in close contact with someone who is at risk. These medications can help patients avoid complications from the flu  but have side effects that you should know. Possible side effects from Tamiflu or oseltamivir include nausea, vomiting, diarrhea, dizziness, headaches, eye redness, sleep problems or other respiratory symptoms. You should not take Tamiflu if you have an allergy to oseltamivir or any to the ingredients in Tamiflu.  Based upon your symptoms and potential risk factors I have prescribed Oseltamivir (Tamiflu).  It has been sent to your designated pharmacy.  You will take one 75 mg capsule orally twice a day for the next 5 days.  ANYONE WHO HAS FLU SYMPTOMS SHOULD: . Stay home. The flu is highly contagious and going out or to work exposes others! . Be sure to drink plenty of fluids. Water is fine as well as fruit juices, sodas and electrolyte beverages. You may want to stay away from caffeine or alcohol. If you are nauseated, try taking small sips of liquids. How do you know if you are getting enough fluid? Your urine should be a pale yellow or almost colorless. . Get  rest. . Taking a steamy shower or using a humidifier may help nasal congestion and ease sore throat pain. Using a saline nasal spray works much the same way. . Cough drops, hard candies and sore throat lozenges may ease your cough. . Line up a caregiver. Have someone check on you regularly.   GET HELP RIGHT AWAY IF: . You cannot keep down liquids or your medications. . You become short of breath . Your fell like you are going to pass out or loose consciousness. . Your symptoms persist after you have completed your treatment plan MAKE SURE YOU   Understand these instructions.  Will watch your condition.  Will get help right away if you are not doing well or get worse.  Your e-visit answers were reviewed by a board certified advanced clinical practitioner to complete your personal care plan.  Depending on the condition, your plan could have included both over the counter or prescription medications.  If there is a problem please reply  once you have received a response from your provider.  Your safety is important to us.  If you have drug allergies check your prescription carefully.    You can use MyChart to ask questions about today's visit, request a non-urgent call back, or ask for a work or school excuse for 24 hours related to this e-Visit. If it has been greater than 24 hours you will need to follow up with your provider, or enter a new   e-Visit to address those concerns.  You will get an e-mail in the next two days asking about your experience.  I hope that your e-visit has been valuable and will speed your recovery. Thank you for using e-visits.  5 minutes spent reviewing and documenting in chart.  

## 2018-04-14 ENCOUNTER — Telehealth: Payer: 59 | Admitting: Nurse Practitioner

## 2018-04-14 DIAGNOSIS — R059 Cough, unspecified: Secondary | ICD-10-CM

## 2018-04-14 DIAGNOSIS — R05 Cough: Secondary | ICD-10-CM | POA: Diagnosis not present

## 2018-04-14 MED ORDER — PREDNISONE 20 MG PO TABS: ORAL_TABLET | ORAL | 0 refills | Status: DC

## 2018-04-14 NOTE — Addendum Note (Signed)
Addended by: Bennie Pierini on: 04/14/2018 04:29 PM   Modules accepted: Orders

## 2018-04-14 NOTE — Progress Notes (Signed)
Due to wheezing steroid rx was sent to pharmacy. Meds ordered this encounter  Medications  . predniSONE (DELTASONE) 20 MG tablet    Sig: 2 po at sametime daily for 5 days    Dispense:  10 tablet    Refill:  0    Order Specific Question:   Supervising Provider    Answer:   Eber Hong [[id number]]

## 2018-04-14 NOTE — Progress Notes (Signed)

## 2018-04-26 DIAGNOSIS — G4733 Obstructive sleep apnea (adult) (pediatric): Secondary | ICD-10-CM | POA: Diagnosis not present

## 2018-05-22 ENCOUNTER — Ambulatory Visit: Payer: Self-pay | Admitting: Internal Medicine

## 2018-05-26 DIAGNOSIS — G4733 Obstructive sleep apnea (adult) (pediatric): Secondary | ICD-10-CM | POA: Diagnosis not present

## 2018-06-10 ENCOUNTER — Encounter: Payer: Self-pay | Admitting: Internal Medicine

## 2018-06-10 ENCOUNTER — Ambulatory Visit (INDEPENDENT_AMBULATORY_CARE_PROVIDER_SITE_OTHER): Payer: 59 | Admitting: Internal Medicine

## 2018-06-10 ENCOUNTER — Other Ambulatory Visit: Payer: Self-pay

## 2018-06-10 VITALS — BP 146/80 | HR 70 | Temp 98.2°F

## 2018-06-10 DIAGNOSIS — G4733 Obstructive sleep apnea (adult) (pediatric): Secondary | ICD-10-CM

## 2018-06-10 NOTE — Progress Notes (Signed)
[facility] [place] [facility] Consultation      Assessment and Plan:  Obstructive Sleep Apnea --Moderate OSA with AHI of 19.  On auto CPAP pressure range 5-20. --Feels that she is doing well with CPAP. Currrently compliance is low, encouraged to increase use.   Obesity, depression, right tonsillar hypertrophy.  --OSA can be related to above conditions therefore treatment of OSA is an important part of their management.    Date: 06/10/2018  MRN# [id number] Kimberly [name] 09/17/1975  Referring Physician: for sleep issues.   Kimberly [name] is a 43 y.o. old female seen in consultation for chief complaint of:    Chief Complaint  Patient presents with  . Follow-up    wearing cpap avg 6-8hr nightly- feels pressure & mask okay.     HPI:  The patient is a 43 year old female, works at the cancer center and was seen for symptoms of excessive daytime sleepiness.  She was sent for sleep study which showed moderate OSA with AHI of 19, she was started on CPAP auto with pressure range 5-20. Since her last visit she has been using her cpap, she notes that she is using it and feels that she is much more awake during the day, she is no longer snoring.  She notes that occasionally she wakes with a leak but then adjusts her mask and it goes away.   Denies sleep paralysis, no sleep walking, no cataplexy. Denies jaw pain, but sometimes her jaw pops when she opens it.  She was seen by ENT due to allergies and drainage, was told that her tonsil was enlarged.    **CPAP download 05/11/2018-06/09/2018>> usage greater than 4 hours is 13/30 days.  Average usage on days used is 5 hours 41 minutes.  Pressure ranges 5-20.  Median pressure 11, 90 95th percentile pressure is 15, max pressure of 16.  Leaks are mildly elevated.  Residual apnea index is 0.7.  Overall this shows inadequate compliance with CPAP with excellent control of obstructive sleep apnea when used. **HST 03/16/18>>moderate OSA with AHI of 19.  Recommend auto cpap 5-20cm h2O.  Medication:    Current Outpatient Medications:  .  albuterol (PROAIR HFA) 108 (90 Base) MCG/ACT inhaler, Inhale into the lungs every 6 (six) hours as needed for wheezing or shortness of breath., Disp: , Rfl:  .  amLODipine (NORVASC) 2.5 MG tablet, Take 1 tablet (2.5 mg total) by mouth daily. In am, Disp: 90 tablet, Rfl: 3 .  cetirizine (ZYRTEC) 10 MG tablet, Take 10 mg by mouth daily., Disp: , Rfl:  .  Cholecalciferol 50000 units capsule, Take 1 capsule (50,000 Units total) by mouth once a week., Disp: 13 capsule, Rfl: 1 .  escitalopram (LEXAPRO) 20 MG tablet, Take 1 tablet (20 mg total) by mouth daily., Disp: 90 tablet, Rfl: 1 .  ferrous sulfate 325 (65 FE) MG tablet, Take 325 mg by mouth 2 (two) times daily with a meal., Disp: , Rfl:  .  fluticasone (FLONASE) 50 MCG/ACT nasal spray, Place 1-2 sprays into both nostrils daily. Max 2 sprays each nostril, Disp: 16 g, Rfl: 11 .  Olopatadine HCl 0.2 % SOLN, Apply 1 drop to eye daily. Each eye, Disp: 1 Bottle, Rfl: 11 .  oseltamivir (TAMIFLU) 75 MG capsule, Take 1 capsule (75 mg total) by mouth 2 (two) times daily., Disp: 10 capsule, Rfl: 0 .  predniSONE (DELTASONE) 20 MG tablet, 2 po at sametime daily for 5 days, Disp: 10 tablet, Rfl: 0   Allergies:  Patient has no known allergies.  Review of Systems:  Constitutional: Feels well. Cardiovascular: Denies chest pain, exertional chest pain.  Pulmonary: Denies hemoptysis, pleuritic chest pain.   The remainder of systems were reviewed and were found to be negative other than what is documented in the HPI.        LABORATORY PANEL:   CBC No results for input(s): WBC, HGB, HCT, PLT in the last 168 hours. ------------------------------------------------------------------------------------------------------------------  Chemistries  No results for input(s): NA, K, CL, CO2, GLUCOSE, BUN, CREATININE, CALCIUM, MG, AST, ALT, ALKPHOS, BILITOT in the last 168 hours.   Invalid input(s): GFRCGP ------------------------------------------------------------------------------------------------------------------  Cardiac Enzymes No results for input(s): TROPONINI in the last 168 hours. ------------------------------------------------------------  RADIOLOGY:  No results found.     Thank  you for the consultation and for allowing [facility] to assist in the care of your patient. Our recommendations are noted above.  Please contact us if we can be of further service.   Wells Guileseep Daril Warga, M.D., F.C.C.P.  Board Certified in Internal Medicine, Pulmonary Medicine, Critical Care Medicine, and Sleep Medicine.   Pulmonary and Critical Care Office Number: [phone]   06/10/2018

## 2018-06-10 NOTE — Patient Instructions (Signed)
Try to use CPAP every night for the whole night.

## 2018-06-12 ENCOUNTER — Ambulatory Visit: Payer: Self-pay | Admitting: Internal Medicine

## 2018-06-26 DIAGNOSIS — G4733 Obstructive sleep apnea (adult) (pediatric): Secondary | ICD-10-CM | POA: Diagnosis not present

## 2018-06-27 DIAGNOSIS — G4733 Obstructive sleep apnea (adult) (pediatric): Secondary | ICD-10-CM | POA: Diagnosis not present

## 2018-07-21 ENCOUNTER — Encounter: Payer: Self-pay | Admitting: Internal Medicine

## 2018-07-29 ENCOUNTER — Other Ambulatory Visit: Payer: Self-pay | Admitting: Internal Medicine

## 2018-07-29 DIAGNOSIS — F419 Anxiety disorder, unspecified: Secondary | ICD-10-CM

## 2018-07-29 DIAGNOSIS — F329 Major depressive disorder, single episode, unspecified: Secondary | ICD-10-CM

## 2018-07-29 DIAGNOSIS — Z01419 Encounter for gynecological examination (general) (routine) without abnormal findings: Secondary | ICD-10-CM | POA: Diagnosis not present

## 2018-07-29 DIAGNOSIS — Z1231 Encounter for screening mammogram for malignant neoplasm of breast: Secondary | ICD-10-CM | POA: Diagnosis not present

## 2018-07-29 DIAGNOSIS — Z6841 Body Mass Index (BMI) 40.0 and over, adult: Secondary | ICD-10-CM | POA: Diagnosis not present

## 2018-07-29 MED ORDER — BUPROPION HCL ER (SR) 150 MG PO TB12: 150.0000 mg | ORAL_TABLET | Freq: Every day | ORAL | 3 refills | Status: DC

## 2018-07-29 MED ORDER — ESCITALOPRAM OXALATE 10 MG PO TABS: 10.0000 mg | ORAL_TABLET | Freq: Every day | ORAL | 0 refills | Status: DC

## 2018-07-29 NOTE — Telephone Encounter (Signed)
DR please advise. Thanks 

## 2018-09-03 ENCOUNTER — Other Ambulatory Visit: Payer: Self-pay | Admitting: Internal Medicine

## 2018-09-03 DIAGNOSIS — N920 Excessive and frequent menstruation with regular cycle: Secondary | ICD-10-CM | POA: Diagnosis not present

## 2018-09-03 DIAGNOSIS — I10 Essential (primary) hypertension: Secondary | ICD-10-CM

## 2018-09-03 DIAGNOSIS — N939 Abnormal uterine and vaginal bleeding, unspecified: Secondary | ICD-10-CM | POA: Diagnosis not present

## 2018-09-03 DIAGNOSIS — N924 Excessive bleeding in the premenopausal period: Secondary | ICD-10-CM | POA: Diagnosis not present

## 2018-09-03 MED ORDER — AMLODIPINE BESYLATE 2.5 MG PO TABS: 2.5000 mg | ORAL_TABLET | Freq: Every day | ORAL | 3 refills | Status: DC

## 2018-09-07 ENCOUNTER — Encounter: Payer: Self-pay | Admitting: Internal Medicine

## 2018-10-02 DIAGNOSIS — G4733 Obstructive sleep apnea (adult) (pediatric): Secondary | ICD-10-CM | POA: Diagnosis not present

## 2018-12-03 ENCOUNTER — Other Ambulatory Visit: Payer: Self-pay

## 2018-12-05 ENCOUNTER — Telehealth: Payer: Self-pay | Admitting: *Deleted

## 2018-12-05 NOTE — Telephone Encounter (Signed)
Patient called ,covid results still pending advised to call back for results .

## 2019-01-21 DIAGNOSIS — N920 Excessive and frequent menstruation with regular cycle: Secondary | ICD-10-CM | POA: Diagnosis not present

## 2019-01-21 DIAGNOSIS — Z3202 Encounter for pregnancy test, result negative: Secondary | ICD-10-CM | POA: Diagnosis not present

## 2019-01-21 DIAGNOSIS — N840 Polyp of corpus uteri: Secondary | ICD-10-CM | POA: Diagnosis not present

## 2019-06-16 IMAGING — US US ABDOMEN COMPLETE
1 series · 14 of 25 positions shown · non-contrast
Comparison: None.

CLINICAL DATA: Elevated liver enzymes.

EXAM:
ABDOMEN ULTRASOUND COMPLETE

[Series 1: us abdomen complete · 14 of 106 slices shown]
[im 1/106]
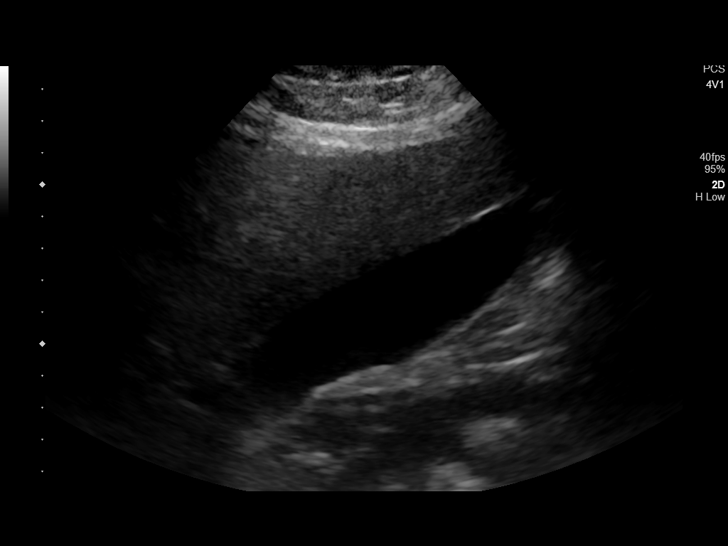
[im 9/106]
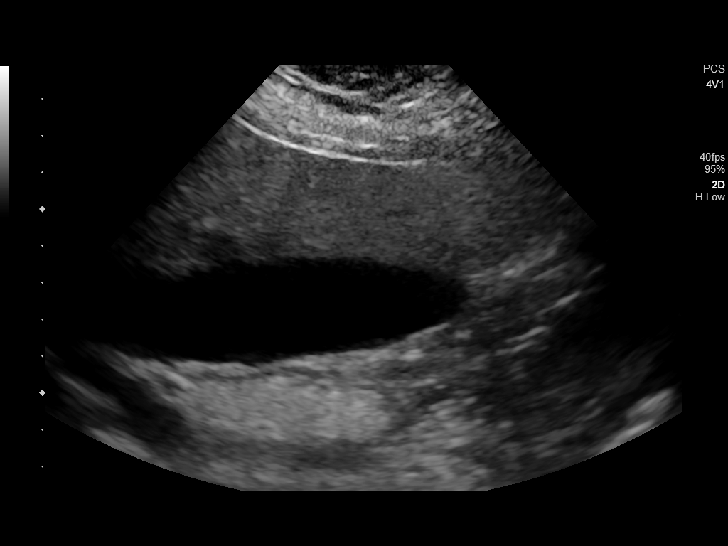
[im 18/106]
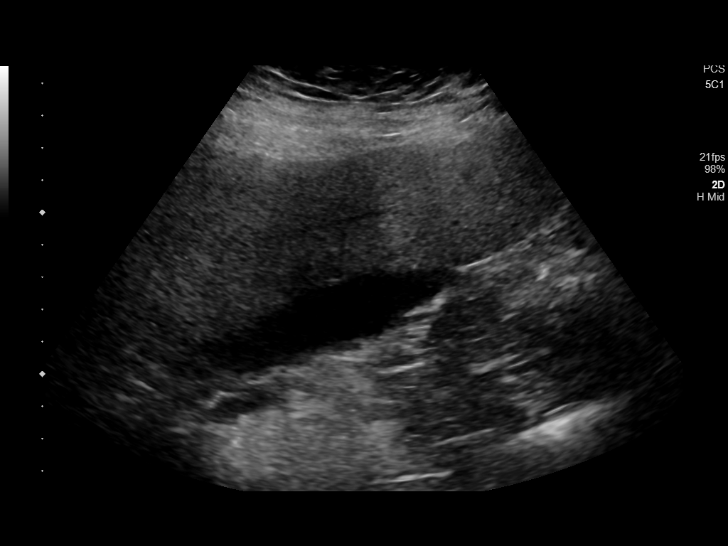
[im 27/106]
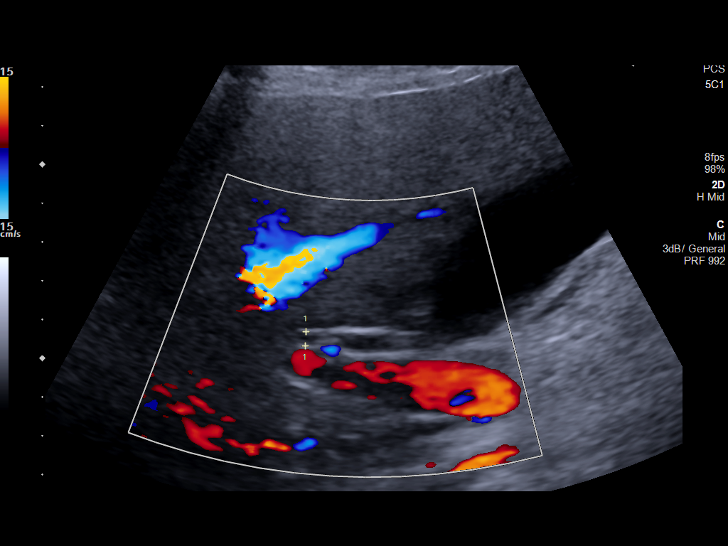
[im 36/106]
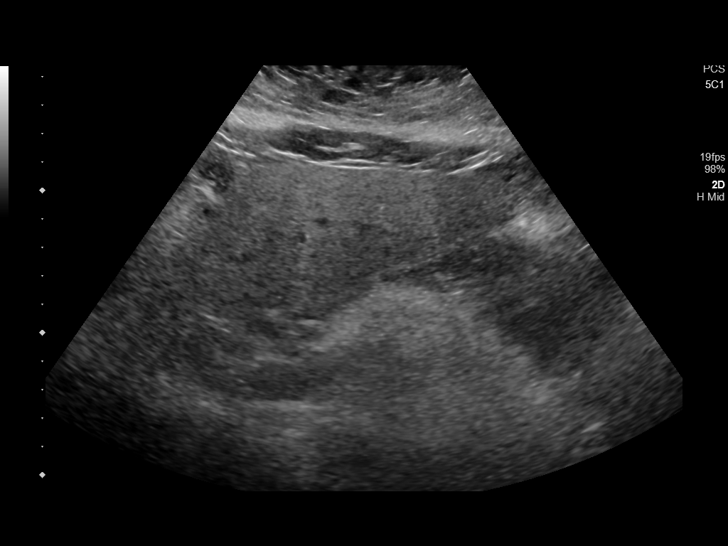
[im 40/106]
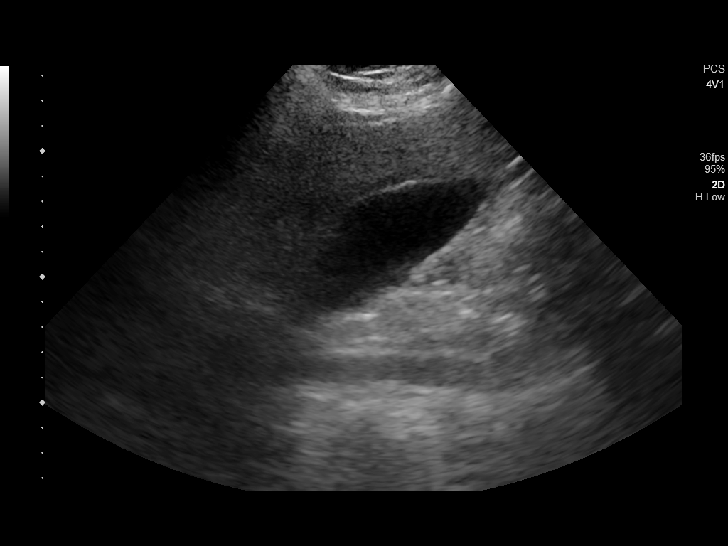
[im 49/106]
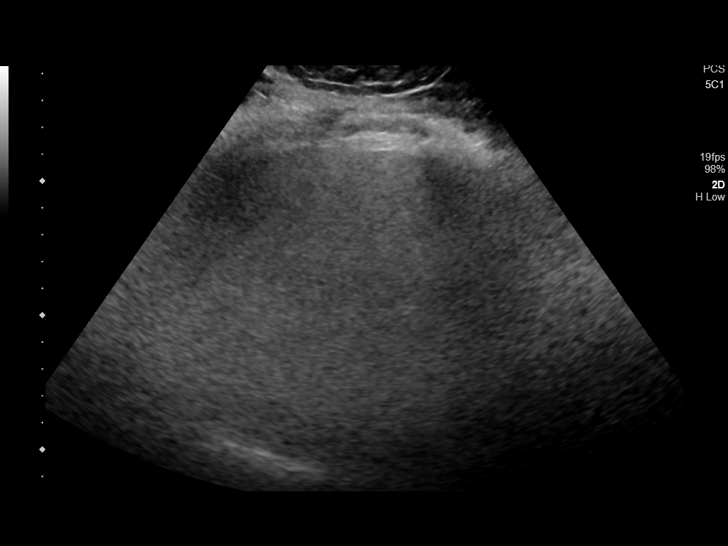
[im 57/106]
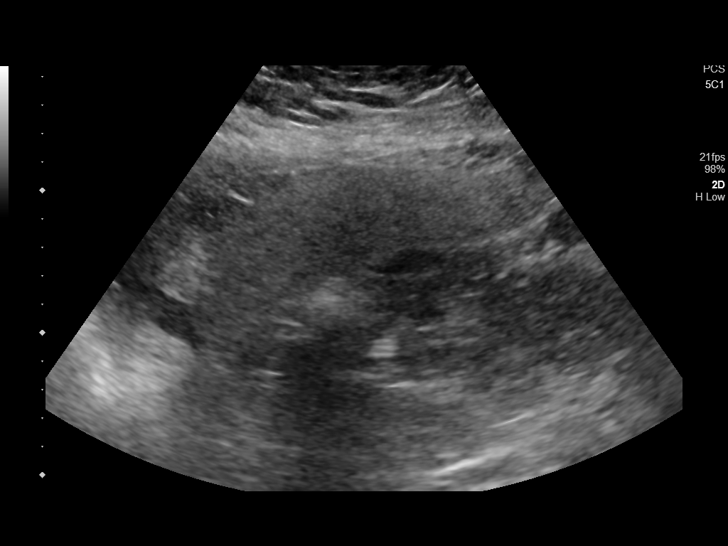
[im 66/106]
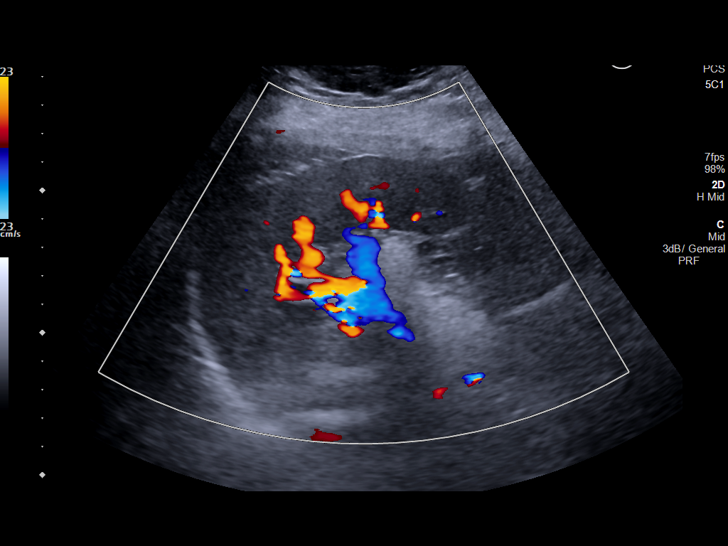
[im 71/106]
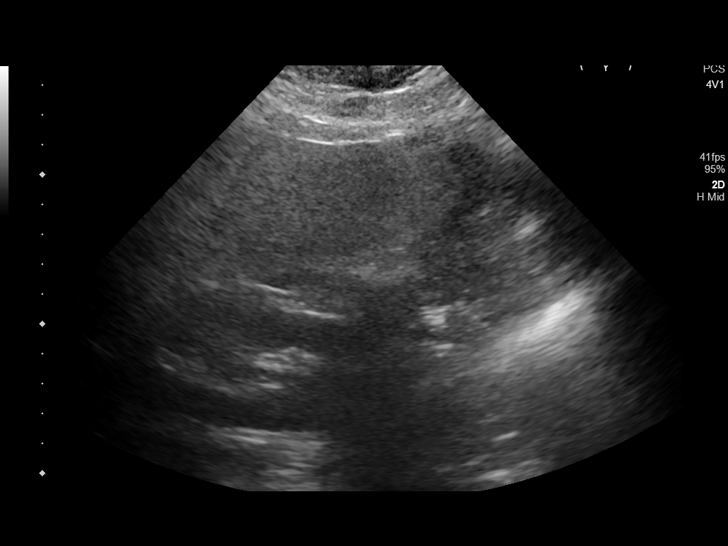
[im 79/106]
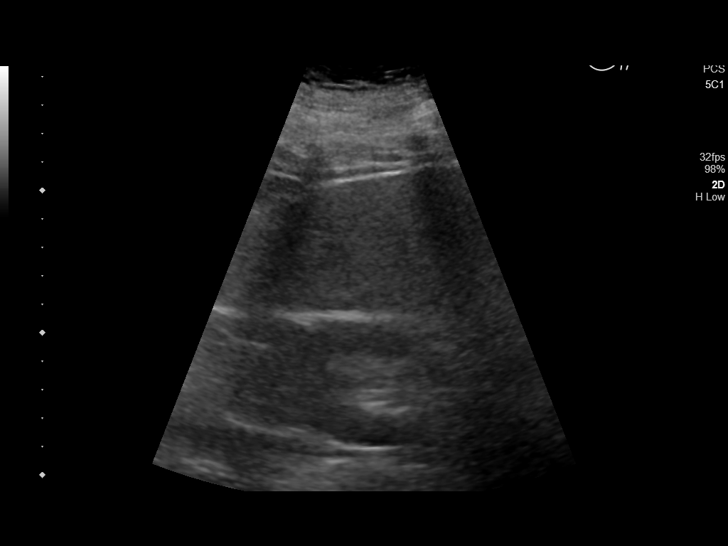
[im 88/106]
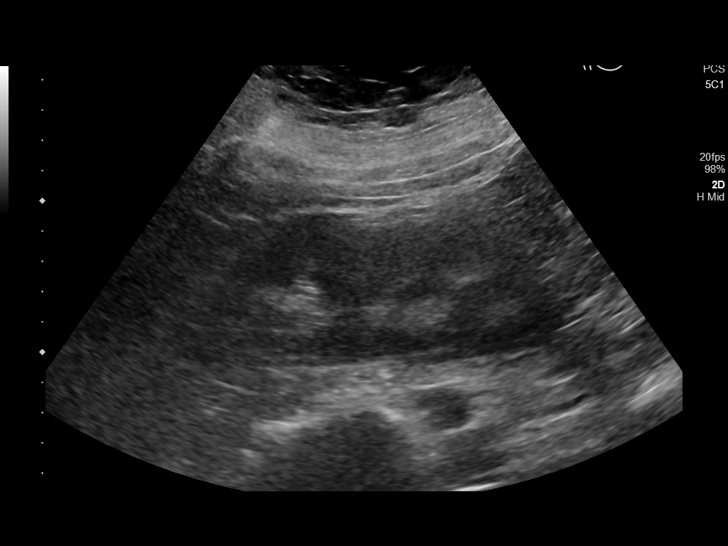
[im 97/106]
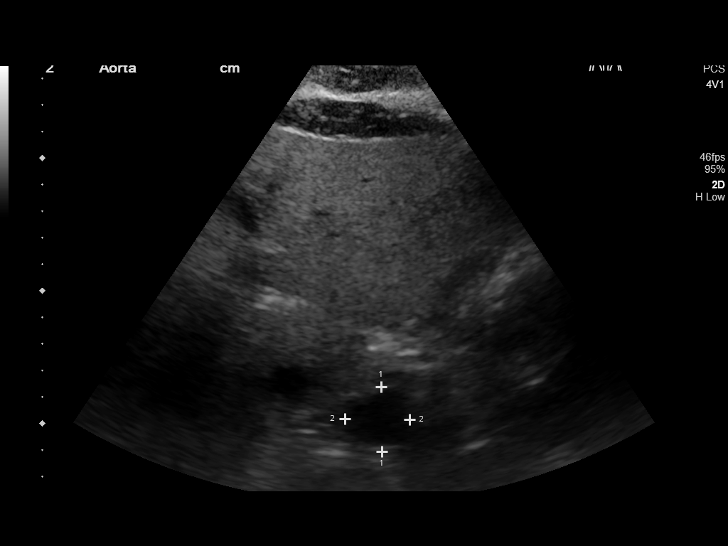
[im 106/106]
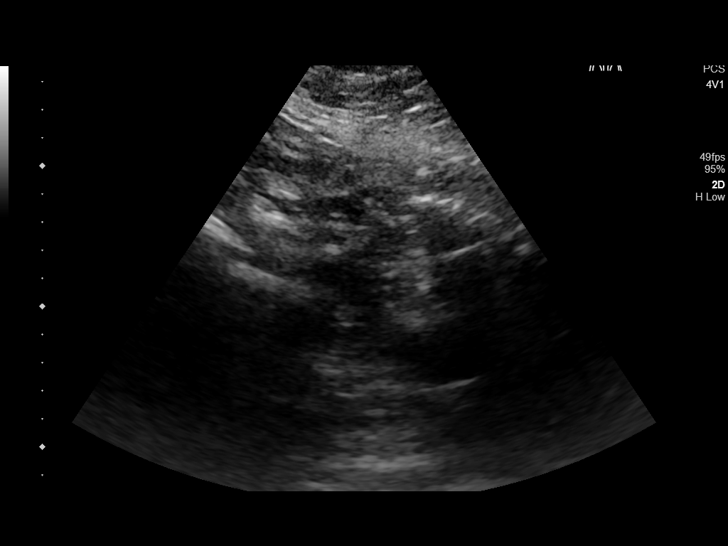

[14 of 25 positions shown; findings below may reference images not displayed]

FINDINGS: Gallbladder: A few gallstones are seen, largest measuring 6 mm. No
evidence of gallbladder dilatation or wall thickening. No
sonographic Murphy sign noted by sonographer.

Common bile duct: Diameter: 4 mm, within normal limits.

Liver: Diffusely increased echogenicity of the hepatic parenchyma,
consistent with hepatic steatosis. No hepatic mass identified.
Portal vein is patent on color Doppler imaging with normal direction
of blood flow towards the liver.

IVC: No abnormality visualized.

Pancreas: Visualized portion unremarkable.

Spleen: Size and appearance within normal limits.

Right Kidney: Length: 11.0 cm. Echogenicity within normal limits. No
mass or hydronephrosis visualized.

Left Kidney: Length: 12.4 cm. Echogenicity within normal limits. No
mass or hydronephrosis visualized.

Abdominal aorta: No aneurysm visualized.

Other findings: None.
IMPRESSION: Cholelithiasis. No sonographic signs of cholecystitis or biliary
ductal dilatation.

Diffuse hepatic steatosis.

## 2019-07-21 ENCOUNTER — Ambulatory Visit: Payer: 59 | Admitting: Adult Health

## 2019-07-30 DIAGNOSIS — Z6841 Body Mass Index (BMI) 40.0 and over, adult: Secondary | ICD-10-CM | POA: Diagnosis not present

## 2019-07-30 DIAGNOSIS — Z01419 Encounter for gynecological examination (general) (routine) without abnormal findings: Secondary | ICD-10-CM | POA: Diagnosis not present

## 2019-07-30 DIAGNOSIS — Z1231 Encounter for screening mammogram for malignant neoplasm of breast: Secondary | ICD-10-CM | POA: Diagnosis not present

## 2019-07-30 DIAGNOSIS — Z8042 Family history of malignant neoplasm of prostate: Secondary | ICD-10-CM | POA: Diagnosis not present

## 2019-07-30 DIAGNOSIS — Z803 Family history of malignant neoplasm of breast: Secondary | ICD-10-CM | POA: Diagnosis not present

## 2019-09-21 ENCOUNTER — Telehealth: Payer: Self-pay | Admitting: Internal Medicine

## 2019-09-21 ENCOUNTER — Other Ambulatory Visit: Payer: Self-pay | Admitting: Internal Medicine

## 2019-09-21 DIAGNOSIS — I10 Essential (primary) hypertension: Secondary | ICD-10-CM

## 2019-09-21 DIAGNOSIS — F419 Anxiety disorder, unspecified: Secondary | ICD-10-CM

## 2019-09-21 MED ORDER — AMLODIPINE BESYLATE 2.5 MG PO TABS: 2.5000 mg | ORAL_TABLET | Freq: Every day | ORAL | 0 refills | Status: DC

## 2019-09-21 MED ORDER — BUPROPION HCL ER (SR) 150 MG PO TB12: 150.0000 mg | ORAL_TABLET | Freq: Every day | ORAL | 0 refills | Status: DC

## 2019-09-21 NOTE — Telephone Encounter (Signed)
Pt not seen in >1 year please sch f/u in person fasting and can have labs same day   Thanks TMS

## 2019-09-22 ENCOUNTER — Telehealth: Payer: Self-pay | Admitting: Internal Medicine

## 2019-09-22 NOTE — Telephone Encounter (Signed)
Lm to call office to schedule appointment with Dr. Shirlee Latch. It has been over a year since she has been seen.

## 2019-10-22 ENCOUNTER — Telehealth: Payer: Self-pay | Admitting: *Deleted

## 2019-10-22 NOTE — Telephone Encounter (Signed)
Please place future orders for lab appt.  

## 2019-10-23 ENCOUNTER — Other Ambulatory Visit: Payer: Self-pay | Admitting: Internal Medicine

## 2019-10-23 ENCOUNTER — Other Ambulatory Visit: Payer: 59

## 2019-10-23 ENCOUNTER — Ambulatory Visit: Payer: 59 | Admitting: Internal Medicine

## 2019-10-23 DIAGNOSIS — E611 Iron deficiency: Secondary | ICD-10-CM

## 2019-10-23 DIAGNOSIS — R7303 Prediabetes: Secondary | ICD-10-CM

## 2019-10-23 DIAGNOSIS — Z1389 Encounter for screening for other disorder: Secondary | ICD-10-CM

## 2019-10-23 DIAGNOSIS — I10 Essential (primary) hypertension: Secondary | ICD-10-CM

## 2019-10-23 DIAGNOSIS — E785 Hyperlipidemia, unspecified: Secondary | ICD-10-CM

## 2019-10-23 DIAGNOSIS — E559 Vitamin D deficiency, unspecified: Secondary | ICD-10-CM

## 2019-10-23 DIAGNOSIS — Z1329 Encounter for screening for other suspected endocrine disorder: Secondary | ICD-10-CM

## 2019-11-12 ENCOUNTER — Other Ambulatory Visit: Payer: 59

## 2019-12-17 ENCOUNTER — Ambulatory Visit: Payer: 59 | Admitting: Internal Medicine

## 2019-12-24 ENCOUNTER — Encounter: Payer: Self-pay | Admitting: Internal Medicine

## 2019-12-25 ENCOUNTER — Encounter: Payer: Self-pay | Admitting: Oncology

## 2019-12-25 ENCOUNTER — Other Ambulatory Visit: Payer: Self-pay | Admitting: Oncology

## 2019-12-25 ENCOUNTER — Ambulatory Visit (HOSPITAL_COMMUNITY)
Admission: RE | Admit: 2019-12-25 | Discharge: 2019-12-25 | Disposition: A | Discharge status: Home / Self Care | Payer: 59 | Source: Ambulatory Visit | Attending: Pulmonary Disease | Admitting: Pulmonary Disease

## 2019-12-25 DIAGNOSIS — U071 COVID-19: Secondary | ICD-10-CM | POA: Diagnosis not present

## 2019-12-25 MED ORDER — SODIUM CHLORIDE 0.9 % IV SOLN
Freq: Once | INTRAVENOUS | Status: DC
  Filled 2019-12-25: qty 20

## 2019-12-25 MED ORDER — SODIUM CHLORIDE 0.9 % IV SOLN: INTRAVENOUS | Status: DC | PRN

## 2019-12-25 MED ORDER — SODIUM CHLORIDE 0.9 % IV SOLN
1200.0000 mg | Freq: Once | INTRAVENOUS | Status: AC
  Administered 2019-12-25: 1200 mg via INTRAVENOUS

## 2019-12-25 MED ORDER — DIPHENHYDRAMINE HCL 50 MG/ML IJ SOLN: 50.0000 mg | Freq: Once | INTRAMUSCULAR | Status: DC | PRN

## 2019-12-25 MED ORDER — FAMOTIDINE IN NACL 20-0.9 MG/50ML-% IV SOLN: 20.0000 mg | Freq: Once | INTRAVENOUS | Status: DC | PRN

## 2019-12-25 MED ORDER — EPINEPHRINE 0.3 MG/0.3ML IJ SOAJ: 0.3000 mg | Freq: Once | INTRAMUSCULAR | Status: DC | PRN

## 2019-12-25 MED ORDER — ALBUTEROL SULFATE HFA 108 (90 BASE) MCG/ACT IN AERS: 2.0000 | INHALATION_SPRAY | Freq: Once | RESPIRATORY_TRACT | Status: DC | PRN

## 2019-12-25 MED ORDER — METHYLPREDNISOLONE SODIUM SUCC 125 MG IJ SOLR: 125.0000 mg | Freq: Once | INTRAMUSCULAR | Status: DC | PRN

## 2019-12-25 NOTE — Discharge Instructions (Signed)
10 Things You Can Do to Manage Your COVID-19 Symptoms at Home If you have possible or confirmed COVID-19: 1. Stay home from work and school. And stay away from other public places. If you must go out, avoid using any kind of public transportation, ridesharing, or taxis. 2. Monitor your symptoms carefully. If your symptoms get worse, call your healthcare provider immediately. 3. Get rest and stay hydrated. 4. If you have a medical appointment, call the healthcare provider ahead of time and tell them that you have or may have COVID-19. 5. For medical emergencies, call 911 and notify the dispatch personnel that you have or may have COVID-19. 6. Cover your cough and sneezes with a tissue or use the inside of your elbow. 7. Wash your hands often with soap and water for at least 20 seconds or clean your hands with an alcohol-based hand sanitizer that contains at least 60% alcohol. 8. As much as possible, stay in a specific room and away from other people in your home. Also, you should use a separate bathroom, if available. If you need to be around other people in or outside of the home, wear a mask. 9. Avoid sharing personal items with other people in your household, like dishes, towels, and bedding. 10. Clean all surfaces that are touched often, like counters, tabletops, and doorknobs. Use household cleaning sprays or wipes according to the label instructions. cdc.gov/coronavirus 07/16/2018 This information is not intended to replace advice given to you by your health care provider. Make sure you discuss any questions you have with your health care provider. Document Revised: 12/18/2018 Document Reviewed: 12/18/2018 Elsevier Patient Education  2020 Elsevier Inc. What types of side effects do monoclonal antibody drugs cause?  Common side effects  In general, the more common side effects caused by monoclonal antibody drugs include: . Allergic reactions, such as hives or itching . Flu-like signs and  symptoms, including chills, fatigue, fever, and muscle aches and pains . Nausea, vomiting . Diarrhea . Skin rashes . Low blood pressure   The CDC is recommending patients who receive monoclonal antibody treatments wait at least 90 days before being vaccinated.  Currently, there are no data on the safety and efficacy of mRNA COVID-19 vaccines in persons who received monoclonal antibodies or convalescent plasma as part of COVID-19 treatment. Based on the estimated half-life of such therapies as well as evidence suggesting that reinfection is uncommon in the 90 days after initial infection, vaccination should be deferred for at least 90 days, as a precautionary measure until additional information becomes available, to avoid interference of the antibody treatment with vaccine-induced immune responses. If you have any questions or concerns after the infusion please call the Advanced Practice Provider on call at [phone]. This number is ONLY intended for your use regarding questions or concerns about the infusion post-treatment side-effects.  Please do not provide this number to others for use. For return to work notes please contact your primary care provider.   If someone you know is interested in receiving treatment please have them call the COVID hotline at [phone].   

## 2019-12-25 NOTE — Progress Notes (Signed)
I connected by phone with  Kimberly [name] to discuss the potential use of an new treatment for mild to moderate COVID-19 viral infection in non-hospitalized patients.   This patient is a age/sex that meets the FDA criteria for Emergency Use Authorization of casirivimab\imdevimab.  Has a (+) direct SARS-CoV-2 viral test result 1. Has mild or moderate COVID-19  2. Is ? 44 years of age and weighs ? 40 kg 3. Is NOT hospitalized due to COVID-19 4. Is NOT requiring oxygen therapy or requiring an increase in baseline oxygen flow rate due to COVID-19 5. Is within 10 days of symptom onset 6. Has at least one of the high risk factor(s) for progression to severe COVID-19 and/or hospitalization as defined in EUA. Specific high risk criteria : Past Medical History:  Diagnosis Date  . Abnormal Pap smear   . Allergy   . Anemia   . Anxiety    no meds during pregnancy  . Asthma   . Chicken pox   . Chlamydia   . Depression   . Gastroesophageal reflux in pregancy    ocasional -  . Heart murmur   . Hypertension    PIH - No meds  . Hypokalemia    Potassium 2.7 August 02, 2011  . Hypokalemia   . Preeclampsia   . Tachycardia   ?  ?    Symptom onset  12/22/19   I have spoken and communicated the following to the patient or parent/caregiver:   1. FDA has authorized the emergency use of casirivimab\imdevimab for the treatment of mild to moderate COVID-19 in adults and pediatric patients with positive results of direct SARS-CoV-2 viral testing who are 25 years of age and older weighing at least 40 kg, and who are at high risk for progressing to severe COVID-19 and/or hospitalization.   2. The significant known and potential risks and benefits of casirivimab\imdevimab, and the extent to which such potential risks and benefits are unknown.   3. Information on available alternative treatments and the risks and benefits of those alternatives, including clinical trials.   4. Patients treated with  casirivimab\imdevimab should continue to self-isolate and use infection control measures (e.g., wear mask, isolate, social distance, avoid sharing personal items, clean and disinfect "high touch" surfaces, and frequent handwashing) according to CDC guidelines.    5. The patient or parent/caregiver has the option to accept or refuse casirivimab\imdevimab .   After reviewing this information with the patient, The patient agreed to proceed with receiving casirivimab\imdevimab infusion and will be provided a copy of the Fact sheet prior to receiving the infusion.Mignon Pine, AGNP-C [phone] (Infusion Center Hotline)

## 2019-12-25 NOTE — Progress Notes (Signed)
  Diagnosis: COVID-19  Physician:  Dr. Patrick Wright  Procedure:   Medication fact sheet provided to patient; all questions answered.  Allergies reviewed with patient.  IV placed.  Regen-Cov administered via IV infusion.  Complications: No immediate complications noted.  Discharge: Discharged home   Kimberly [name] 12/25/2019   

## 2019-12-25 NOTE — Progress Notes (Signed)
Patient reviewed Fact Sheet for Patients, Parents, and Caregivers for Emergency Use Authorization (EUA) of Regen-Cov for the Treatment of Coronavirus.  Patient also reviewed and is agreeable to the estimated cost of treatment.  Patient is agreeable to proceed.   

## 2020-01-28 ENCOUNTER — Ambulatory Visit: Payer: 59 | Admitting: Internal Medicine

## 2020-03-31 ENCOUNTER — Encounter: Payer: Self-pay | Admitting: Internal Medicine

## 2020-04-01 ENCOUNTER — Encounter: Payer: Self-pay | Admitting: Internal Medicine

## 2020-04-01 ENCOUNTER — Other Ambulatory Visit: Payer: Self-pay | Admitting: Internal Medicine

## 2020-04-01 ENCOUNTER — Other Ambulatory Visit: Payer: Self-pay

## 2020-04-01 ENCOUNTER — Other Ambulatory Visit (INDEPENDENT_AMBULATORY_CARE_PROVIDER_SITE_OTHER): Payer: 59

## 2020-04-01 ENCOUNTER — Telehealth (INDEPENDENT_AMBULATORY_CARE_PROVIDER_SITE_OTHER): Payer: 59 | Admitting: Internal Medicine

## 2020-04-01 VITALS — Ht 64.0 in | Wt 260.0 lb

## 2020-04-01 DIAGNOSIS — J069 Acute upper respiratory infection, unspecified: Secondary | ICD-10-CM | POA: Diagnosis not present

## 2020-04-01 DIAGNOSIS — R059 Cough, unspecified: Secondary | ICD-10-CM

## 2020-04-01 DIAGNOSIS — Z20822 Contact with and (suspected) exposure to covid-19: Secondary | ICD-10-CM

## 2020-04-01 DIAGNOSIS — M791 Myalgia, unspecified site: Secondary | ICD-10-CM

## 2020-04-01 DIAGNOSIS — B379 Candidiasis, unspecified: Secondary | ICD-10-CM | POA: Diagnosis not present

## 2020-04-01 DIAGNOSIS — J4521 Mild intermittent asthma with (acute) exacerbation: Secondary | ICD-10-CM

## 2020-04-01 LAB — POCT INFLUENZA A/B
Influenza A, POC: NEGATIVE
Influenza B, POC: NEGATIVE

## 2020-04-01 MED ORDER — PREDNISONE 20 MG PO TABS
40.0000 mg | ORAL_TABLET | Freq: Every day | ORAL | 0 refills | Status: DC
Start: 2020-04-01 — End: 2020-04-01

## 2020-04-01 MED ORDER — AZITHROMYCIN 250 MG PO TABS: ORAL_TABLET | ORAL | 0 refills | Status: DC

## 2020-04-01 MED ORDER — FLUCONAZOLE 150 MG PO TABS: 150.0000 mg | ORAL_TABLET | Freq: Once | ORAL | 0 refills | Status: DC

## 2020-04-01 MED ORDER — ALBUTEROL SULFATE HFA 108 (90 BASE) MCG/ACT IN AERS: 1.0000 | INHALATION_SPRAY | Freq: Four times a day (QID) | RESPIRATORY_TRACT | 11 refills | Status: DC | PRN

## 2020-04-01 MED ORDER — GUAIFENESIN-CODEINE 100-10 MG/5ML PO SYRP: 5.0000 mL | ORAL_SOLUTION | Freq: Every evening | ORAL | 0 refills | Status: DC | PRN

## 2020-04-01 NOTE — Progress Notes (Signed)
Started coughing Sunday, Patient is now having body aches, chills, and hoarseness.   Patient had a negative COVID test at home. Patient has been having some congestion.   No around the Patient is sick.

## 2020-04-01 NOTE — Progress Notes (Signed)
Virtual Visit via Video Note  I connected with Kimberly [name]  on 04/01/20 at  2:50 PM EDT by a video enabled telemedicine application and verified that I am speaking with the correct person using two identifiers.  Location patient: home, [place] Location provider:work or home office Persons participating in the virtual visit: patient, provider  I discussed the limitations of evaluation and management by telemedicine and the availability of in person appointments. The patient expressed understanding and agreed to proceed.   HPI: Sick visit with cough, chest congestion, wheezing, chills, fatigue. 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 Sx's with Sunday with cough with pheglm pheglm is mild. Monday/Tuesday body aches, chills, nasal congestion able to taste and smell, she has also had wheezing w/o fever. Lying on left side she coughs worse. Tried Tylenol, Ibuprofen, Mucinex cold and flu and helps. Denies sob   She does have h/o asthma  -COVID-19 vaccine status: 2/2   ROS: See pertinent positives and negatives per HPI.  Past Medical History:  Diagnosis Date  . Abnormal Pap smear   . Allergy   . Anemia   . Anxiety    no meds during pregnancy  . Asthma   . Chicken pox   . Chlamydia   . Depression   . Gastroesophageal reflux in pregancy    ocasional -  . Heart murmur   . Hypertension    PIH - No meds  . Hypokalemia    Potassium 2.7 August 02, 2011  . Hypokalemia   . Preeclampsia   . Tachycardia     Past Surgical History:  Procedure Laterality Date  . CESAREAN SECTION  2005, 2013   x 2  . WISDOM TOOTH EXTRACTION       Current Outpatient Medications:  .  albuterol (VENTOLIN HFA) 108 (90 Base) MCG/ACT inhaler, Inhale 1-2 puffs into the lungs every 6 (six) hours as needed for wheezing or shortness of breath., Disp: 18 g, Rfl: 11 .  amLODipine (NORVASC) 2.5 MG tablet, Take 1 tablet (2.5 mg total) by mouth daily. In am appt  further refills, Disp: 90 tablet, Rfl: 0 .  azithromycin (ZITHROMAX) 250 MG tablet, 2 pills day 1 and 1 pill day 2-5 with food, Disp: 6 tablet, Rfl: 0 .  cetirizine (ZYRTEC) 10 MG tablet, Take 10 mg by mouth daily., Disp: , Rfl:  .  guaiFENesin-codeine (ROBITUSSIN AC) 100-10 MG/5ML syrup, Take 5 mLs by mouth at bedtime as needed for cough., Disp: 120 mL, Rfl: 0 .  predniSONE (DELTASONE) 20 MG tablet, Take 2 tablets (40 mg total) by mouth daily with breakfast. X 5-10 days with food, Disp: 20 tablet, Rfl: 0 .  buPROPion (WELLBUTRIN SR) 150 MG 12 hr tablet, Take 1 tablet (150 mg total) by mouth daily. appt further refills (Patient not taking: Reported on 04/01/2020), Disp: 90 tablet, Rfl: 0 .  Cholecalciferol 50000 units capsule, Take 1 capsule (50,000 Units total) by mouth once a week. (Patient not taking: Reported on 04/01/2020), Disp: 13 capsule, Rfl: 1 .  escitalopram (LEXAPRO) 10 MG tablet, Take 1 tablet (10 mg total) by mouth daily. (Patient not taking: Reported on 04/01/2020), Disp: 30 tablet, Rfl: 0 .  ferrous sulfate 325 (65 FE) MG tablet, Take 325 mg by mouth 2 (two) times daily with a meal. (Patient not taking: Reported on 04/01/2020), Disp: , Rfl:  .  fluticasone (FLONASE) 50 MCG/ACT nasal spray, Place 1-2 sprays into both  nostrils daily. Max 2 sprays each nostril (Patient not taking: Reported on 04/01/2020), Disp: 16 g, Rfl: 11 .  Olopatadine HCl 0.2 % SOLN, Apply 1 drop to eye daily. Each eye (Patient not taking: Reported on 04/01/2020), Disp: 1 Bottle, Rfl: 11  EXAM:  VITALS per patient if applicable:  GENERAL: alert, oriented, appears sick and in no acute distress  HEENT: atraumatic, conjunttiva clear, no obvious abnormalities on inspection of external nose and ears  NECK: normal movements of the head and neck  LUNGS: on inspection no signs of respiratory distress, breathing rate appears normal, no obvious gross SOB, gasping or wheezing  CV: no obvious cyanosis  MS: moves all visible  extremities without noticeable abnormality  PSYCH/NEURO: pleasant and cooperative, no obvious depression or anxiety, speech and thought processing grossly intact  ASSESSMENT AND PLAN:  Discussed the following assessment and plan:  Upper respiratory tract infection could be triggering asthma exacberation - Plan: POCT Influenza A/B, azithromycin (ZITHROMAX) 250 MG tablet, predniSONE (DELTASONE) 20 MG tablet, albuterol (VENTOLIN HFA) 108 (90 Base) MCG/ACT inhaler, Novel Coronavirus, NAA ([facility]),  Flu and covid negative  cxr if not better   guaiFENesin-codeine (ROBITUSSIN AC) 100-10 MG/5ML syrup, predniSONE (DELTASONE) 20 MG tablet, albuterol (VENTOLIN HFA) 108 (90 Base) MCG/ACT inhaler   Yeast infection - Plan: fluconazole (DIFLUCAN) 150 MG tablet  -we discussed possible serious and likely etiologies, options for evaluation and workup, limitations of telemedicine visit vs in person visit, treatment, treatment risks and precautions.     I discussed the assessment and treatment plan with the patient. The patient was provided an opportunity to ask questions and all were answered. The patient agreed with the plan and demonstrated an understanding of the instructions.    Time spent 30 min Bevelyn Buckles, MD

## 2020-04-02 LAB — SARS-COV-2, NAA 2 DAY TAT

## 2020-04-02 LAB — NOVEL CORONAVIRUS, NAA: SARS-CoV-2, NAA: NOT DETECTED

## 2020-04-04 ENCOUNTER — Telehealth: Payer: Self-pay

## 2020-04-04 NOTE — Telephone Encounter (Signed)
LVM for pt to call back and schedule fasting labs and CPE-pt is due

## 2020-06-06 ENCOUNTER — Other Ambulatory Visit: Payer: Self-pay

## 2020-06-06 MED ORDER — CHLORHEXIDINE GLUCONATE 0.12 % MT SOLN
15.0000 mL | Freq: Two times a day (BID) | OROMUCOSAL | 0 refills | Status: DC
  Filled 2020-06-06: qty 473, 16d supply, fill #0

## 2020-07-22 ENCOUNTER — Telehealth: Payer: 59 | Admitting: Physician Assistant

## 2020-07-22 ENCOUNTER — Encounter: Payer: Self-pay | Admitting: Physician Assistant

## 2020-09-30 ENCOUNTER — Encounter: Payer: 59 | Admitting: Family

## 2021-03-27 ENCOUNTER — Telehealth: Payer: 59 | Admitting: Physician Assistant

## 2021-03-27 DIAGNOSIS — R3989 Other symptoms and signs involving the genitourinary system: Secondary | ICD-10-CM | POA: Diagnosis not present

## 2021-03-27 MED ORDER — CEPHALEXIN 500 MG PO CAPS: 500.0000 mg | ORAL_CAPSULE | Freq: Two times a day (BID) | ORAL | 0 refills | Status: DC

## 2021-03-27 NOTE — Progress Notes (Signed)

## 2021-04-04 ENCOUNTER — Telehealth: Payer: 59 | Admitting: Physician Assistant

## 2021-04-04 DIAGNOSIS — N390 Urinary tract infection, site not specified: Secondary | ICD-10-CM

## 2021-04-04 NOTE — Progress Notes (Signed)
Based on what you shared with me, I feel your condition warrants further evaluation and I recommend that you be seen in a face to face visit. ? ?Being that you have failed initial treatment we recommend for you to be seen in person for them to collect a urine specimen and have sent for a culture to see if any resistant bacteria are present. ?  ?NOTE: There will be NO CHARGE for this eVisit ?  ?If you are having a true medical emergency please call 911.   ?  ? For an urgent face to face visit, [place] [facility] has six urgent care centers for your convenience:  ?  ? [facility] ?Get Driving Directions ?[phone] ?[id number] ?[place], [place] [address] ?  ? [facility]) ?Get Driving Directions ?[phone] ?[address] ?[place], [place] [address] ? ?[facility]) ?Get Driving Directions ?[phone] ?[address] ?[place],  [place]  [address] ? ?[facility] ?Get Driving Directions ?[phone] ?[address], [address] ?[place], [place] [address] ?  ?[facility] [place] ?Get Driving Directions  ?[phone] ?[address] ?[address] ?[place], [place] [address] ?  ?[facility] ?Get Driving Directions ?[phone] ?[address] ?[place], [place] [address] ? ?Your MyChart E-visit questionnaire answers were reviewed by a board certified advanced clinical practitioner to complete your personal care plan based on your specific symptoms.  Thank you for using e-Visits. ?  ?I provided 5 minutes of non face-to-face time during this encounter for chart review and documentation.  ? ?

## 2021-04-06 ENCOUNTER — Encounter: Payer: Self-pay | Admitting: Internal Medicine

## 2021-04-06 ENCOUNTER — Other Ambulatory Visit: Payer: Self-pay

## 2021-04-06 ENCOUNTER — Ambulatory Visit: Payer: 59 | Admitting: Internal Medicine

## 2021-04-06 VITALS — BP 124/80 | HR 89 | Temp 98.0°F | Ht 64.0 in | Wt 268.6 lb

## 2021-04-06 DIAGNOSIS — N3941 Urge incontinence: Secondary | ICD-10-CM | POA: Diagnosis not present

## 2021-04-06 DIAGNOSIS — R109 Unspecified abdominal pain: Secondary | ICD-10-CM

## 2021-04-06 DIAGNOSIS — Z1211 Encounter for screening for malignant neoplasm of colon: Secondary | ICD-10-CM | POA: Diagnosis not present

## 2021-04-06 DIAGNOSIS — R7303 Prediabetes: Secondary | ICD-10-CM | POA: Diagnosis not present

## 2021-04-06 DIAGNOSIS — Z13220 Encounter for screening for lipoid disorders: Secondary | ICD-10-CM

## 2021-04-06 DIAGNOSIS — Z1329 Encounter for screening for other suspected endocrine disorder: Secondary | ICD-10-CM

## 2021-04-06 DIAGNOSIS — E559 Vitamin D deficiency, unspecified: Secondary | ICD-10-CM | POA: Diagnosis not present

## 2021-04-06 DIAGNOSIS — Z13818 Encounter for screening for other digestive system disorders: Secondary | ICD-10-CM | POA: Diagnosis not present

## 2021-04-06 NOTE — Progress Notes (Signed)
Chief Complaint  ?Patient presents with  ? Abdominal Cramping  ? Bloated  ? Possible Pregnancy  ? ?Acute visit c/o lower ab cramping and bloating x 2 weeks does not tolerate dairy she was given keflex 03/27/21 from e visit for urgency and burning with urination pain 2/10 cramping nothing else tried other than cranberry supplements. Sp tubal ligation and ablation ? ?Review of Systems  ?Constitutional:  Negative for weight loss.  ?HENT:  Negative for hearing loss.   ?Eyes:  Negative for blurred vision.  ?Respiratory:  Negative for shortness of breath.   ?Cardiovascular:  Negative for chest pain.  ?Gastrointestinal:  Positive for abdominal pain. Negative for blood in stool.  ?Genitourinary:  Positive for dysuria and urgency.  ?Musculoskeletal:  Negative for falls and joint pain.  ?Skin:  Negative for rash.  ?Neurological:  Negative for headaches.  ?Psychiatric/Behavioral:  Negative for depression.   ?Past Medical History:  ?Diagnosis Date  ? Abnormal Pap smear   ? Allergy   ? Anemia   ? Anxiety   ? no meds during pregnancy  ? Asthma   ? Chicken pox   ? Chlamydia   ? COVID-19   ? Depression   ? Gastroesophageal reflux in pregancy   ? ocasional -  ? Heart murmur   ? Hypertension   ? PIH - No meds  ? Hypokalemia   ? Potassium 2.7 August 02, 2011  ? Hypokalemia   ? Preeclampsia   ? Tachycardia   ? ?Past Surgical History:  ?Procedure Laterality Date  ? CESAREAN SECTION  2005, 2013  ? x 2  ? WISDOM TOOTH EXTRACTION    ? ?Family History  ?Problem Relation Age of Onset  ? Hypertension Mother   ? Suicidality Father   ? Alcohol abuse Father   ? Heart disease Maternal Grandmother   ?     valve replacement  ? Hypertension Maternal Grandmother   ? Supraventricular tachycardia Daughter   ? Asthma Son   ? Cancer Paternal Grandmother   ?     breast   ? Diabetes Paternal Grandmother   ? Depression Paternal Grandfather   ? Heart disease Paternal Grandfather   ? Other Neg Hx   ? ?Social History  ? ?Socioeconomic History  ? Marital status:  Married  ?  Spouse name: Not on file  ? Number of children: Not on file  ? Years of education: Not on file  ? Highest education level: Not on file  ?Occupational History  ? Not on file  ?Tobacco Use  ? Smoking status: Former  ?  Packs/day: 0.25  ?  Years: 1.50  ?  Pack years: 0.38  ?  Types: Cigarettes  ?  Quit date: 1997  ?  Years since quitting: 26.2  ? Smokeless tobacco: Never  ?Vaping Use  ? Vaping Use: Never used  ?Substance and Sexual Activity  ? Alcohol use: No  ?  Alcohol/week: 0.0 standard drinks  ? Drug use: No  ? Sexual activity: Yes  ?  Birth control/protection: None  ?Other Topics Concern  ? Not on file  ?Social History Narrative  ? Married   ? 2 kids son and daughter (40 and 69 y.o as of 08/2017)   ? 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 Smoked socially as teenager   ? No guns, wears seat belt, safe in relationship   ? ?Social Determinants of Health  ? ?Financial Resource Strain: Not on file  ?Food Insecurity: Not on  file  ?Transportation Needs: Not on file  ?Physical Activity: Not on file  ?Stress: Not on file  ?Social Connections: Not on file  ?Intimate Partner Violence: Not on file  ? ?Current Meds  ?Medication Sig  ? cephALEXin (KEFLEX) 500 MG capsule Take 1 capsule (500 mg total) by mouth 2 (two) times daily.  ? cetirizine (ZYRTEC) 10 MG tablet Take 10 mg by mouth daily.  ? ?No Known Allergies ?No results found for this or any previous visit (from the past 2160 hour(s)). ?Objective  ?Body mass index is 46.11 kg/m?. ?Wt Readings from Last 3 Encounters:  ?04/06/21 268 lb 9.6 oz (121.8 kg)  ?04/01/20 260 lb (117.9 kg)  ?02/19/18 280 lb (127 kg)  ? ?Temp Readings from Last 3 Encounters:  ?04/06/21 98 ?F (36.7 ?C) (Oral)  ?12/25/19 98.2 ?F (36.8 ?C) (Oral)  ?06/10/18 98.2 ?F (36.8 ?C) (Oral)  ? ?BP Readings from Last 3 Encounters:  ?04/06/21 124/80  ?12/25/19 (!) 146/91  ?06/10/18 (!) 146/80  ? ?Pulse Readings from Last 3 Encounters:  ?04/06/21 89  ?12/25/19 78  ?06/10/18 70  ? ? ?Physical Exam ?Vitals and  nursing note reviewed.  ?Constitutional:   ?   Appearance: Normal appearance. She is well-developed and well-groomed.  ?HENT:  ?   Head: Normocephalic and atraumatic.  ?Eyes:  ?   Conjunctiva/sclera: Conjunctivae normal.  ?   Pupils: Pupils are equal, round, and reactive to light.  ?Cardiovascular:  ?   Rate and Rhythm: Normal rate and regular rhythm.  ?   Heart sounds: Normal heart sounds. No murmur heard. ?Pulmonary:  ?   Effort: Pulmonary effort is normal.  ?   Breath sounds: Normal breath sounds.  ?Abdominal:  ?   General: Abdomen is flat. Bowel sounds are normal.  ?   Tenderness: There is no abdominal tenderness.  ?Musculoskeletal:     ?   General: No tenderness.  ?Skin: ?   General: Skin is warm and dry.  ?Neurological:  ?   General: No focal deficit present.  ?   Mental Status: She is alert and oriented to person, place, and time. Mental status is at baseline.  ?   Cranial Nerves: Cranial nerves 2-12 are intact.  ?   Motor: Motor function is intact.  ?   Coordination: Coordination is intact.  ?   Gait: Gait is intact.  ?Psychiatric:     ?   Attention and Perception: Attention and perception normal.     ?   Mood and Affect: Mood and affect normal.     ?   Speech: Speech normal.     ?   Behavior: Behavior normal. Behavior is cooperative.     ?   Thought Content: Thought content normal.     ?   Cognition and Memory: Cognition and memory normal.     ?   Judgment: Judgment normal.  ? ? ?Assessment  ?Plan  ?Urge incontinence of urine - Plan: Urinalysis, Routine w reflex microscopic, Urine Culture, Urine cytology ancillary only([place]), CANCELED: Urine cytology ancillary only([place]) ? ?Abdominal cramping - Plan: Urinalysis, Routine w reflex microscopic, Urine Culture, Urine cytology ancillary only([place]), CBC with Differential/Platelet, Comprehensive metabolic panel ?Consider CT abdomen/pelvis in the future to further w/u ? ?HM ?Do CPE at f/u  ?Screening for colon cancer - Plan: Ambulatory referral  to Gastroenterology  ?Pap and mammogram upcoming Dr. Rana Snare [facility] in [place] ? ? ?Provider: Dr. French Ana McLean-[facility]ocuz[facility]-Internal Medicine  ?

## 2021-04-06 NOTE — Patient Instructions (Signed)
Culturelle or Align (pre/probiotics) ?Abdominal Pain, Adult ?Pain in the abdomen (abdominal pain) can be caused by many things. Often, abdominal pain is not serious and it gets better with no treatment or by being treated at home. However, sometimes abdominal pain is serious. ?Your health care provider will ask questions about your medical history and do a physical exam to try to determine the cause of your abdominal pain. ?Follow these instructions at home: ?Medicines ?Take over-the-counter and prescription medicines only as told by your health care provider. ?Do not take a laxative unless told by your health care provider. ?General instructions ? ?Watch your condition for any changes. ?Drink enough fluid to keep your urine pale yellow. ?Keep all follow-up visits as told by your health care provider. This is important. ?Contact a health care provider if: ?Your abdominal pain changes or gets worse. ?You are not hungry or you lose weight without trying. ?You are constipated or have diarrhea for more than 2-3 days. ?You have pain when you urinate or have a bowel movement. ?Your abdominal pain wakes you up at night. ?Your pain gets worse with meals, after eating, or with certain foods. ?You are vomiting and cannot keep anything down. ?You have a fever. ?You have blood in your urine. ?Get help right away if: ?Your pain does not go away as soon as your health care provider told you to expect. ?You cannot stop vomiting. ?Your pain is only in areas of the abdomen, such as the right side or the left lower portion of the abdomen. Pain on the right side could be caused by appendicitis. ?You have bloody or black stools, or stools that look like tar. ?You have severe pain, cramping, or bloating in your abdomen. ?You have signs of dehydration, such as: ?Dark urine, very little urine, or no urine. ?Cracked lips. ?Dry mouth. ?Sunken eyes. ?Sleepiness. ?Weakness. ?You have trouble breathing or chest pain. ?Summary ?Often, abdominal  pain is not serious and it gets better with no treatment or by being treated at home. However, sometimes abdominal pain is serious. ?Watch your condition for any changes. ?Take over-the-counter and prescription medicines only as told by your health care provider. ?Contact a health care provider if your abdominal pain changes or gets worse. ?Get help right away if you have severe pain, cramping, or bloating in your abdomen. ?This information is not intended to replace advice given to you by your health care provider. Make sure you discuss any questions you have with your health care provider. ?Document Revised: 02/20/2019 Document Reviewed: 05/12/2018 ?Elsevier Patient Education ? [place]. ? ?

## 2021-04-07 LAB — URINALYSIS, ROUTINE W REFLEX MICROSCOPIC
Bilirubin Urine: NEGATIVE
Glucose, UA: NEGATIVE
Hgb urine dipstick: NEGATIVE
Ketones, ur: NEGATIVE
Leukocytes,Ua: NEGATIVE
Nitrite: NEGATIVE
Protein, ur: NEGATIVE
Specific Gravity, Urine: 1.016 (ref 1.001–1.035)
pH: 7 (ref 5.0–8.0)

## 2021-04-07 LAB — URINE CULTURE
MICRO NUMBER:: 13170793
SPECIMEN QUALITY:: ADEQUATE

## 2021-04-26 ENCOUNTER — Ambulatory Visit (INDEPENDENT_AMBULATORY_CARE_PROVIDER_SITE_OTHER): Payer: 59 | Admitting: Internal Medicine

## 2021-04-26 ENCOUNTER — Encounter: Payer: Self-pay | Admitting: Internal Medicine

## 2021-04-26 ENCOUNTER — Other Ambulatory Visit: Payer: Self-pay

## 2021-04-26 VITALS — BP 130/86 | HR 80 | Temp 98.7°F | Resp 14 | Ht 64.0 in | Wt 270.2 lb

## 2021-04-26 DIAGNOSIS — N3941 Urge incontinence: Secondary | ICD-10-CM

## 2021-04-26 DIAGNOSIS — Z0000 Encounter for general adult medical examination without abnormal findings: Secondary | ICD-10-CM

## 2021-04-26 DIAGNOSIS — F419 Anxiety disorder, unspecified: Secondary | ICD-10-CM

## 2021-04-26 DIAGNOSIS — Z13818 Encounter for screening for other digestive system disorders: Secondary | ICD-10-CM | POA: Diagnosis not present

## 2021-04-26 DIAGNOSIS — I10 Essential (primary) hypertension: Secondary | ICD-10-CM

## 2021-04-26 DIAGNOSIS — E559 Vitamin D deficiency, unspecified: Secondary | ICD-10-CM

## 2021-04-26 DIAGNOSIS — H1013 Acute atopic conjunctivitis, bilateral: Secondary | ICD-10-CM

## 2021-04-26 DIAGNOSIS — R7303 Prediabetes: Secondary | ICD-10-CM | POA: Diagnosis not present

## 2021-04-26 DIAGNOSIS — Z1329 Encounter for screening for other suspected endocrine disorder: Secondary | ICD-10-CM | POA: Diagnosis not present

## 2021-04-26 DIAGNOSIS — R109 Unspecified abdominal pain: Secondary | ICD-10-CM | POA: Diagnosis not present

## 2021-04-26 DIAGNOSIS — Z13220 Encounter for screening for lipoid disorders: Secondary | ICD-10-CM | POA: Diagnosis not present

## 2021-04-26 LAB — CBC WITH DIFFERENTIAL/PLATELET
Basophils Absolute: 0.1 10*3/uL (ref 0.0–0.1)
Basophils Relative: 0.8 % (ref 0.0–3.0)
Eosinophils Absolute: 0.6 10*3/uL (ref 0.0–0.7)
Eosinophils Relative: 8.3 % — ABNORMAL HIGH (ref 0.0–5.0)
HCT: 46.9 % — ABNORMAL HIGH (ref 36.0–46.0)
Hemoglobin: 15.5 g/dL — ABNORMAL HIGH (ref 12.0–15.0)
Lymphocytes Relative: 32.7 % (ref 12.0–46.0)
Lymphs Abs: 2.4 10*3/uL (ref 0.7–4.0)
MCHC: 33 g/dL (ref 30.0–36.0)
MCV: 84.7 fl (ref 78.0–100.0)
Monocytes Absolute: 0.5 10*3/uL (ref 0.1–1.0)
Monocytes Relative: 7.2 % (ref 3.0–12.0)
Neutro Abs: 3.7 10*3/uL (ref 1.4–7.7)
Neutrophils Relative %: 51 % (ref 43.0–77.0)
Platelets: 213 10*3/uL (ref 150.0–400.0)
RBC: 5.54 Mil/uL — ABNORMAL HIGH (ref 3.87–5.11)
RDW: 14.5 % (ref 11.5–15.5)
WBC: 7.2 10*3/uL (ref 4.0–10.5)

## 2021-04-26 LAB — COMPREHENSIVE METABOLIC PANEL
ALT: 31 U/L (ref 0–35)
AST: 21 U/L (ref 0–37)
Albumin: 4 g/dL (ref 3.5–5.2)
Alkaline Phosphatase: 51 U/L (ref 39–117)
BUN: 8 mg/dL (ref 6–23)
CO2: 24 mEq/L (ref 19–32)
Calcium: 9.3 mg/dL (ref 8.4–10.5)
Chloride: 104 mEq/L (ref 96–112)
Creatinine, Ser: 0.66 mg/dL (ref 0.40–1.20)
GFR: 106.01 mL/min (ref >60.00)
Glucose, Bld: 89 mg/dL (ref 70–99)
Potassium: 4 mEq/L (ref 3.5–5.1)
Sodium: 138 mEq/L (ref 135–145)
Total Bilirubin: 0.4 mg/dL (ref 0.2–1.2)
Total Protein: 6.8 g/dL (ref 6.0–8.3)

## 2021-04-26 LAB — LIPID PANEL
Cholesterol: 234 mg/dL — ABNORMAL HIGH (ref 0–200)
HDL: 55.7 mg/dL (ref >39.00)
LDL Cholesterol: 142 mg/dL — ABNORMAL HIGH (ref 0–99)
NonHDL: 178.64
Total CHOL/HDL Ratio: 4
Triglycerides: 185 mg/dL — ABNORMAL HIGH (ref 0.0–149.0)
VLDL: 37 mg/dL (ref 0.0–40.0)

## 2021-04-26 LAB — HEMOGLOBIN A1C: Hgb A1c MFr Bld: 6 % (ref 4.6–6.5)

## 2021-04-26 LAB — VITAMIN D 25 HYDROXY (VIT D DEFICIENCY, FRACTURES): VITD: 18.29 ng/mL — ABNORMAL LOW (ref 30.00–100.00)

## 2021-04-26 LAB — TSH: TSH: 0.97 u[IU]/mL (ref 0.35–5.50)

## 2021-04-26 MED ORDER — BUPROPION HCL ER (XL) 150 MG PO TB24
150.0000 mg | ORAL_TABLET | Freq: Every day | ORAL | 3 refills | Status: DC
  Filled 2021-04-26: qty 90, 90d supply, fill #0
  Filled 2021-08-08: qty 90, 90d supply, fill #1
  Filled 2021-11-06: qty 90, 90d supply, fill #2
  Filled 2022-01-10 – 2022-01-22 (×2): qty 90, 90d supply, fill #3

## 2021-04-26 MED ORDER — ESCITALOPRAM OXALATE 10 MG PO TABS
10.0000 mg | ORAL_TABLET | Freq: Every day | ORAL | 3 refills | Status: DC
  Filled 2021-04-26: qty 90, 90d supply, fill #0
  Filled 2021-08-08: qty 90, 90d supply, fill #1
  Filled 2021-11-07: qty 90, 90d supply, fill #2
  Filled 2022-02-22: qty 90, 90d supply, fill #3

## 2021-04-26 MED ORDER — PATADAY 0.7 % OP SOLN
1.0000 [drp] | Freq: Every day | OPHTHALMIC | 11 refills | Status: DC
  Filled 2021-04-26: qty 2.5, fill #0

## 2021-04-26 NOTE — Addendum Note (Signed)
Addended by: Warden Fillers on: 04/26/2021 12:15 PM ? ? Modules accepted: Orders ? ?

## 2021-04-26 NOTE — Progress Notes (Signed)
Chief Complaint  ?Patient presents with  ? Annual Exam  ?  Feeling well overall, fasting this morning. Scheduled for routine pap and mammogram in May.  ? ?Annual doing well  ?1. Htn thinks white coat not checking BP at home and not taking norvasc 2.5 mg qd  ?2. Watery itching eyes taking zyrtec otc not using pataday will rx  ?3. Irritable and anxious work and 46 yo daughter and 5 y.o wants to resume lexapro and wellbutrin on in the past  ? ? ?Review of Systems  ?Constitutional:  Negative for weight loss.  ?HENT:  Negative for hearing loss.   ?Eyes:  Negative for blurred vision.  ?Respiratory:  Negative for shortness of breath.   ?Cardiovascular:  Negative for chest pain.  ?Gastrointestinal:  Negative for abdominal pain and blood in stool.  ?Genitourinary:  Negative for dysuria.  ?Musculoskeletal:  Negative for falls and joint pain.  ?Skin:  Negative for rash.  ?Neurological:  Negative for headaches.  ?Psychiatric/Behavioral:  Negative for depression.   ?Past Medical History:  ?Diagnosis Date  ? Abnormal Pap smear   ? Allergy   ? Anemia   ? Anxiety   ? no meds during pregnancy  ? Asthma   ? Chicken pox   ? Chlamydia   ? COVID-19   ? Depression   ? Gastroesophageal reflux in pregancy   ? ocasional -  ? Heart murmur   ? Hypertension   ? PIH - No meds  ? Hypokalemia   ? Potassium 2.7 August 02, 2011  ? Hypokalemia   ? Preeclampsia   ? Tachycardia   ? ?Past Surgical History:  ?Procedure Laterality Date  ? CESAREAN SECTION  2005, 2013  ? x 2  ? WISDOM TOOTH EXTRACTION    ? ?Family History  ?Problem Relation Age of Onset  ? Hypertension Mother   ? Suicidality Father   ? Alcohol abuse Father   ? Heart disease Maternal Grandmother   ?     valve replacement  ? Hypertension Maternal Grandmother   ? Supraventricular tachycardia Daughter   ? Asthma Son   ? Cancer Paternal Grandmother   ?     breast   ? Diabetes Paternal Grandmother   ? Depression Paternal Grandfather   ? Heart disease Paternal Grandfather   ? Other Neg Hx    ? ?Social History  ? ?Socioeconomic History  ? Marital status: Married  ?  Spouse name: Not on file  ? Number of children: Not on file  ? Years of education: Not on file  ? Highest education level: Not on file  ?Occupational History  ? Not on file  ?Tobacco Use  ? Smoking status: Former  ?  Packs/day: 0.25  ?  Years: 1.50  ?  Pack years: 0.38  ?  Types: Cigarettes  ?  Quit date: 1997  ?  Years since quitting: 26.2  ? Smokeless tobacco: Never  ?Vaping Use  ? Vaping Use: Never used  ?Substance and Sexual Activity  ? Alcohol use: No  ?  Alcohol/week: 0.0 standard drinks  ? Drug use: No  ? Sexual activity: Yes  ?  Birth control/protection: None  ?Other Topics Concern  ? Not on file  ?Social History Narrative  ? Married   ? 2 kids son and daughter (106 and 91 y.o as of 08/2017)   ? 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 Smoked socially as teenager   ? No guns, wears seat belt, safe  in relationship   ? ?Social Determinants of Health  ? ?Financial Resource Strain: Not on file  ?Food Insecurity: Not on file  ?Transportation Needs: Not on file  ?Physical Activity: Not on file  ?Stress: Not on file  ?Social Connections: Not on file  ?Intimate Partner Violence: Not on file  ? ?Current Meds  ?Medication Sig  ? buPROPion (WELLBUTRIN XL) 150 MG 24 hr tablet Take 1 tablet (150 mg total) by mouth daily.  ? cetirizine (ZYRTEC) 10 MG tablet Take 10 mg by mouth daily.  ? escitalopram (LEXAPRO) 10 MG tablet Take 1 tablet (10 mg total) by mouth daily.  ? Olopatadine HCl (PATADAY) 0.7 % SOLN Apply 1 drop to eye daily.  ? ?No Known Allergies ?Recent Results (from the past 2160 hour(s))  ?Urinalysis, Routine w reflex microscopic     Status: None  ? Collection Time: 04/06/21  9:51 AM  ?Result Value Ref Range  ? Color, Urine YELLOW YELLOW  ? APPearance CLEAR CLEAR  ? Specific Gravity, Urine 1.016 1.001 - 1.035  ? pH 7.0 5.0 - 8.0  ? Glucose, UA NEGATIVE NEGATIVE  ? Bilirubin Urine NEGATIVE NEGATIVE  ? Ketones, ur NEGATIVE NEGATIVE  ? Hgb urine  dipstick NEGATIVE NEGATIVE  ? Protein, ur NEGATIVE NEGATIVE  ? Nitrite NEGATIVE NEGATIVE  ? Leukocytes,Ua NEGATIVE NEGATIVE  ?Urine Culture     Status: None  ? Collection Time: 04/06/21  9:51 AM  ? Specimen: Urine  ?Result Value Ref Range  ? MICRO NUMBER: [id number]   ? SPECIMEN QUALITY: Adequate   ? Sample Source NOT GIVEN   ? STATUS: FINAL   ? Result:    ?  Mixed genital flora isolated. These superficial bacteria are not indicative of a urinary tract infection. No further organism identification is warranted on this specimen. If clinically indicated, recollect clean-catch, mid-stream urine and transfer  ?immediately to Urine Culture Transport Tube. ?  ? ?Objective  ?Body mass index is 46.38 kg/m?. ?Wt Readings from Last 3 Encounters:  ?04/26/21 270 lb 3.2 oz (122.6 kg)  ?04/06/21 268 lb 9.6 oz (121.8 kg)  ?04/01/20 260 lb (117.9 kg)  ? ?Temp Readings from Last 3 Encounters:  ?04/26/21 98.7 ?F (37.1 ?C) (Oral)  ?04/06/21 98 ?F (36.7 ?C) (Oral)  ?12/25/19 98.2 ?F (36.8 ?C) (Oral)  ? ?BP Readings from Last 3 Encounters:  ?04/26/21 130/86  ?04/06/21 124/80  ?12/25/19 (!) 146/91  ? ?Pulse Readings from Last 3 Encounters:  ?04/26/21 80  ?04/06/21 89  ?12/25/19 78  ? ? ?Physical Exam ?Vitals and nursing note reviewed.  ?Constitutional:   ?   Appearance: Normal appearance. She is well-developed and well-groomed.  ?HENT:  ?   Head: Normocephalic and atraumatic.  ?Eyes:  ?   Conjunctiva/sclera: Conjunctivae normal.  ?   Pupils: Pupils are equal, round, and reactive to light.  ?Cardiovascular:  ?   Rate and Rhythm: Normal rate and regular rhythm.  ?   Heart sounds: Normal heart sounds. No murmur heard. ?Pulmonary:  ?   Effort: Pulmonary effort is normal.  ?   Breath sounds: Normal breath sounds.  ?Abdominal:  ?   General: Abdomen is flat. Bowel sounds are normal.  ?   Tenderness: There is no abdominal tenderness.  ?Musculoskeletal:     ?   General: No tenderness.  ?Skin: ?   General: Skin is warm and dry.  ?Neurological:  ?    General: No focal deficit present.  ?   Mental Status: She is alert and  oriented to person, place, and time. Mental status is at baseline.  ?   Cranial Nerves: Cranial nerves 2-12 are intact.  ?   Motor: Motor function is intact.  ?   Coordination: Coordination is intact.  ?   Gait: Gait is intact.  ?Psychiatric:     ?   Attention and Perception: Attention and perception normal.     ?   Mood and Affect: Mood and affect normal.     ?   Speech: Speech normal.     ?   Behavior: Behavior normal. Behavior is cooperative.     ?   Thought Content: Thought content normal.     ?   Cognition and Memory: Cognition and memory normal.     ?   Judgment: Judgment normal.  ? ? ?Assessment  ?Plan  ?Annual physical exam ?See  below ? ?Anxiety - Plan: escitalopram (LEXAPRO) 10 MG tablet, buPROPion (WELLBUTRIN XL) 150 MG 24 hr tablet ?Let me know in 4-6 weeks if wants to increase dose ? ?Allergic conjunctivitis of both eyes - Plan: Olopatadine HCl (PATADAY) 0.7 % SOLN ? ?Hypertension ?Monitor BP not taking norvasc 2.5 mg qd  ? ? ?Prediabetes - Plan: Hemoglobin A1c ? ?HM ?Flu utd ?[employer] 2/2  ?Pap and mammogram upcoming Dr. Corinna Capra [facility] in [place] 05/2021 ?Rec healthy diet and exercise  ?Colonoscopy referral 04/06/21 [facility] ? ?Provider: Dr. Olivia Mackie McLean-Scocuz[facility]-Internal Medicine  ?

## 2021-04-26 NOTE — Patient Instructions (Addendum)
Monitor blood pressure goal <130/<80 ? ?If dose increase lexapro or wellbutrin desired reach out in 4-6 weeks  ?[facility] Dr. Norma Fredrickson or [place]  ?Double check if insurance if covered  ? ?Phone Fax E-mail Address  ?[phone] Not available [address]  ? [place] [address]  ?   ?Specialties     ? ? ?

## 2021-04-27 LAB — HEPATITIS C ANTIBODY
Hepatitis C Ab: NONREACTIVE
SIGNAL TO CUT-OFF: 0.17 (ref <1.00)

## 2021-04-30 ENCOUNTER — Other Ambulatory Visit: Payer: Self-pay | Admitting: Internal Medicine

## 2021-04-30 DIAGNOSIS — E559 Vitamin D deficiency, unspecified: Secondary | ICD-10-CM

## 2021-04-30 MED ORDER — CHOLECALCIFEROL 1.25 MG (50000 UT) PO CAPS
50000.0000 [IU] | ORAL_CAPSULE | ORAL | 1 refills | Status: DC
  Filled 2021-04-30: qty 13, 90d supply, fill #0
  Filled 2021-08-08: qty 13, 90d supply, fill #1

## 2021-05-01 ENCOUNTER — Other Ambulatory Visit: Payer: Self-pay

## 2021-05-01 DIAGNOSIS — R109 Unspecified abdominal pain: Secondary | ICD-10-CM

## 2021-05-08 ENCOUNTER — Other Ambulatory Visit: Payer: Self-pay

## 2021-05-26 NOTE — Addendum Note (Signed)
Addended by: Warden Fillers on: 05/26/2021 04:14 PM ? ? Modules accepted: Orders ? ?

## 2021-05-31 ENCOUNTER — Other Ambulatory Visit: Payer: 59

## 2021-06-05 DIAGNOSIS — Z01419 Encounter for gynecological examination (general) (routine) without abnormal findings: Secondary | ICD-10-CM | POA: Diagnosis not present

## 2021-06-05 DIAGNOSIS — Z6841 Body Mass Index (BMI) 40.0 and over, adult: Secondary | ICD-10-CM | POA: Diagnosis not present

## 2021-06-05 DIAGNOSIS — Z124 Encounter for screening for malignant neoplasm of cervix: Secondary | ICD-10-CM | POA: Diagnosis not present

## 2021-06-05 DIAGNOSIS — Z1231 Encounter for screening mammogram for malignant neoplasm of breast: Secondary | ICD-10-CM | POA: Diagnosis not present

## 2021-07-18 ENCOUNTER — Telehealth: Payer: 59 | Admitting: Nurse Practitioner

## 2021-07-18 DIAGNOSIS — H60331 Swimmer's ear, right ear: Secondary | ICD-10-CM | POA: Diagnosis not present

## 2021-07-18 MED ORDER — CIPROFLOXACIN-DEXAMETHASONE 0.3-0.1 % OT SUSP: 4.0000 [drp] | Freq: Two times a day (BID) | OTIC | 0 refills | Status: DC

## 2021-07-18 NOTE — Progress Notes (Signed)
E Visit for Swimmer's Ear  We are sorry that you are not feeling well. Here is how we plan to help!  I have prescribed: Ciprofloxin 0.2% and dexamethasone otic suspension 4 drops in right ear twice daily for 7 days   In certain cases swimmer's ear may progress to a more serious bacterial infection of the middle or inner ear.  If you have a fever 102 and up and significantly worsening symptoms, this could indicate a more serious infection moving to the middle/inner and needs face to face evaluation in an office by a provider.  Your symptoms should improve over the next 3 days and should resolve in about 7 days.  HOME CARE:  Wash your hands frequently. Do not place the tip of the bottle on your ear or touch it with your fingers. You can take Acetominophen 650 mg every 4-6 hours as needed for pain.  If pain is severe or moderate, you can apply a heating pad (set on low) or hot water bottle (wrapped in a towel) to outer ear for 20 minutes.  This will also increase drainage. Avoid ear plugs Do not use Q-tips After showers, help the water run out by tilting your head to one side.  GET HELP RIGHT AWAY IF:  Fever is over 102.2 degrees. You develop progressive ear pain or hearing loss. Ear symptoms persist longer than 3 days after treatment.  MAKE SURE YOU:  Understand these instructions. Will watch your condition. Will get help right away if you are not doing well or get worse.  TO PREVENT SWIMMER'S EAR: Use a bathing cap or custom fitted swim molds to keep your ears dry. Towel off after swimming to dry your ears. Tilt your head or pull your earlobes to allow the water to escape your ear canal. If there is still water in your ears, consider using a hairdryer on the lowest setting.   Thank you for choosing an e-visit.  Your e-visit answers were reviewed by a board certified advanced clinical practitioner to complete your personal care plan. Depending upon the condition, your plan  could have included both over the counter or prescription medications.  Please review your pharmacy choice. Make sure the pharmacy is open so you can pick up prescription now. If there is a problem, you may contact your provider through [employer] and have the prescription routed to another pharmacy.  Your safety is important to [place]. If you have drug allergies check your prescription carefully.   For the next 24 hours you can use MyChart to ask questions about today's visit, request a non-urgent call back, or ask for a work or school excuse. You will get an email in the next two days asking about your experience. I hope that your e-visit has been valuable and will speed your recovery.  I spent approximately 7 minutes reviewing the patient's history, current symptoms and coordinating their plan of care today.    Meds ordered this encounter  Medications   ciprofloxacin-dexamethasone (CIPRODEX) OTIC suspension    Sig: Place 4 drops into the right ear 2 (two) times daily.    Dispense:  7.5 mL    Refill:  0

## 2021-08-08 ENCOUNTER — Other Ambulatory Visit: Payer: Self-pay

## 2021-11-06 ENCOUNTER — Other Ambulatory Visit: Payer: Self-pay

## 2021-11-07 ENCOUNTER — Other Ambulatory Visit: Payer: Self-pay

## 2022-01-10 ENCOUNTER — Other Ambulatory Visit: Payer: Self-pay

## 2022-01-17 ENCOUNTER — Other Ambulatory Visit: Payer: Self-pay

## 2022-01-20 ENCOUNTER — Telehealth: Payer: Commercial Managed Care - PPO | Admitting: Nurse Practitioner

## 2022-01-20 DIAGNOSIS — J40 Bronchitis, not specified as acute or chronic: Secondary | ICD-10-CM

## 2022-01-20 MED ORDER — PSEUDOEPH-BROMPHEN-DM 30-2-10 MG/5ML PO SYRP: 5.0000 mL | ORAL_SOLUTION | Freq: Four times a day (QID) | ORAL | 0 refills | Status: DC | PRN

## 2022-01-20 MED ORDER — FLUTICASONE PROPIONATE 50 MCG/ACT NA SUSP: 1.0000 | Freq: Every day | NASAL | 0 refills | Status: DC

## 2022-01-20 MED ORDER — AZITHROMYCIN 250 MG PO TABS: ORAL_TABLET | ORAL | 0 refills | Status: AC

## 2022-01-20 NOTE — Progress Notes (Signed)
We are sorry that you are not feeling well.  Here is how we plan to help!  Based on your presentation I believe you most likely have A cough due to bacteria.  When patients have a fever and a productive cough with a change in color or increased sputum production, we are concerned about bacterial bronchitis.  If left untreated it can progress to pneumonia.  If your symptoms do not improve with your treatment plan it is important that you contact your provider.   I have prescribed Azithromyin 250 mg: two tablets now and then one tablet daily for 4 additonal days    In addition you may use a nasal spray fluticasone that has been sent and a prescription cough syrup  From your responses in the eVisit questionnaire you describe inflammation in the upper respiratory tract which is causing a significant cough.  This is commonly called Bronchitis and has four common causes:   Allergies Viral Infections Acid Reflux Bacterial Infection Allergies, viruses and acid reflux are treated by controlling symptoms or eliminating the cause. An example might be a cough caused by taking certain blood pressure medications. You stop the cough by changing the medication. Another example might be a cough caused by acid reflux. Controlling the reflux helps control the cough.  USE OF BRONCHODILATOR ("RESCUE") INHALERS: There is a risk from using your bronchodilator too frequently.  The risk is that over-reliance on a medication which only relaxes the muscles surrounding the breathing tubes can reduce the effectiveness of medications prescribed to reduce swelling and congestion of the tubes themselves.  Although you feel brief relief from the bronchodilator inhaler, your asthma may actually be worsening with the tubes becoming more swollen and filled with mucus.  This can delay other crucial treatments, such as oral steroid medications. If you need to use a bronchodilator inhaler daily, several times per day, you should discuss  this with your provider.  There are probably better treatments that could be used to keep your asthma under control.     HOME CARE Only take medications as instructed by your medical team. Complete the entire course of an antibiotic. Drink plenty of fluids and get plenty of rest. Avoid close contacts especially the very young and the elderly Cover your mouth if you cough or cough into your sleeve. Always remember to wash your hands A steam or ultrasonic humidifier can help congestion.   GET HELP RIGHT AWAY IF: You develop worsening fever. You become short of breath You cough up blood. Your symptoms persist after you have completed your treatment plan MAKE SURE YOU  Understand these instructions. Will watch your condition. Will get help right away if you are not doing well or get worse.    Thank you for choosing an e-visit.  Your e-visit answers were reviewed by a board certified advanced clinical practitioner to complete your personal care plan. Depending upon the condition, your plan could have included both over the counter or prescription medications.  Please review your pharmacy choice. Make sure the pharmacy is open so you can pick up prescription now. If there is a problem, you may contact your provider through [employer] and have the prescription routed to another pharmacy.  Your safety is important to [place]. If you have drug allergies check your prescription carefully.   For the next 24 hours you can use MyChart to ask questions about today's visit, request a non-urgent call back, or ask for a work or school excuse. You will get  an email in the next two days asking about your experience. I hope that your e-visit has been valuable and will speed your recovery.

## 2022-01-20 NOTE — Progress Notes (Signed)
I have spent 5 minutes in review of e-visit questionnaire, review and updating patient chart, medical decision making and response to patient.  ° °Criss Pallone W Andrej Spagnoli, NP ° °  °

## 2022-01-22 ENCOUNTER — Other Ambulatory Visit: Payer: Self-pay

## 2022-02-11 ENCOUNTER — Other Ambulatory Visit: Payer: Self-pay | Admitting: Nurse Practitioner

## 2022-02-11 DIAGNOSIS — J40 Bronchitis, not specified as acute or chronic: Secondary | ICD-10-CM

## 2022-02-12 NOTE — Telephone Encounter (Signed)
Unable to refill per protocol, patient is not a patient at this practice.  Requested Prescriptions  Pending Prescriptions Disp Refills   fluticasone (FLONASE) 50 MCG/ACT nasal spray [Pharmacy Med Name: FLUTICASONE PROP 50 MCG SPRAY] 48 mL 1    Sig: PLACE 1-2 SPRAYS INTO BOTH NOSTRILS DAILY. MAX 2 SPRAYS EACH NOSTRIL     Ear, Nose, and Throat: Nasal Preparations - Corticosteroids Failed - 02/11/2022 12:31 PM      Failed - Valid encounter within last 12 months    Recent Outpatient Visits   None

## 2022-04-11 ENCOUNTER — Other Ambulatory Visit: Payer: Self-pay

## 2022-04-12 ENCOUNTER — Other Ambulatory Visit: Payer: Self-pay

## 2022-04-12 ENCOUNTER — Other Ambulatory Visit: Payer: Self-pay | Admitting: Family Medicine

## 2022-04-12 ENCOUNTER — Other Ambulatory Visit: Payer: Self-pay | Admitting: Nurse Practitioner

## 2022-04-12 DIAGNOSIS — F419 Anxiety disorder, unspecified: Secondary | ICD-10-CM

## 2022-04-12 MED ORDER — BUPROPION HCL ER (XL) 150 MG PO TB24
150.0000 mg | ORAL_TABLET | Freq: Every day | ORAL | 3 refills | Status: DC
  Filled 2022-04-12: qty 90, 90d supply, fill #0
  Filled 2022-06-22 – 2022-07-02 (×2): qty 90, 90d supply, fill #1

## 2022-04-12 NOTE — Addendum Note (Signed)
Addended by: Gracy Racer on: 04/12/2022 01:16 PM   Modules accepted: Orders

## 2022-04-12 NOTE — Addendum Note (Signed)
Addended by: Tomasita Morrow on: 04/12/2022 01:22 PM   Modules accepted: Orders

## 2022-04-12 NOTE — Telephone Encounter (Signed)
Pt need a refill on buPROPion sent to [facility]

## 2022-05-02 ENCOUNTER — Encounter: Payer: 59 | Admitting: Internal Medicine

## 2022-05-16 ENCOUNTER — Encounter: Payer: Commercial Managed Care - PPO | Admitting: Nurse Practitioner

## 2022-06-07 ENCOUNTER — Encounter: Payer: Commercial Managed Care - PPO | Admitting: Nurse Practitioner

## 2022-06-14 ENCOUNTER — Other Ambulatory Visit: Payer: Self-pay | Admitting: Nurse Practitioner

## 2022-06-14 ENCOUNTER — Other Ambulatory Visit: Payer: Self-pay

## 2022-06-14 DIAGNOSIS — F419 Anxiety disorder, unspecified: Secondary | ICD-10-CM

## 2022-06-15 ENCOUNTER — Other Ambulatory Visit: Payer: Self-pay

## 2022-06-18 ENCOUNTER — Other Ambulatory Visit: Payer: Self-pay

## 2022-06-19 ENCOUNTER — Other Ambulatory Visit: Payer: Self-pay

## 2022-06-19 ENCOUNTER — Encounter: Payer: Self-pay | Admitting: Nurse Practitioner

## 2022-06-19 ENCOUNTER — Other Ambulatory Visit: Payer: Self-pay | Admitting: Nurse Practitioner

## 2022-06-19 DIAGNOSIS — F419 Anxiety disorder, unspecified: Secondary | ICD-10-CM

## 2022-06-19 MED FILL — Escitalopram Oxalate Tab 10 MG (Base Equiv): ORAL | 90 days supply | Qty: 90 | Fill #0 | Status: AC

## 2022-06-22 ENCOUNTER — Other Ambulatory Visit: Payer: Self-pay

## 2022-07-02 ENCOUNTER — Other Ambulatory Visit: Payer: Self-pay

## 2022-07-03 ENCOUNTER — Other Ambulatory Visit: Payer: Self-pay

## 2022-07-09 ENCOUNTER — Other Ambulatory Visit: Payer: Self-pay

## 2022-07-10 ENCOUNTER — Other Ambulatory Visit: Payer: Self-pay

## 2022-07-17 ENCOUNTER — Encounter: Payer: Self-pay | Admitting: Nurse Practitioner

## 2022-07-17 ENCOUNTER — Other Ambulatory Visit: Payer: Self-pay

## 2022-07-17 ENCOUNTER — Ambulatory Visit: Payer: Commercial Managed Care - PPO | Admitting: Nurse Practitioner

## 2022-07-17 VITALS — BP 140/80 | HR 108 | Temp 98.2°F | Ht 64.0 in | Wt 285.6 lb

## 2022-07-17 DIAGNOSIS — E785 Hyperlipidemia, unspecified: Secondary | ICD-10-CM

## 2022-07-17 DIAGNOSIS — Z1211 Encounter for screening for malignant neoplasm of colon: Secondary | ICD-10-CM

## 2022-07-17 DIAGNOSIS — F419 Anxiety disorder, unspecified: Secondary | ICD-10-CM

## 2022-07-17 DIAGNOSIS — Z1329 Encounter for screening for other suspected endocrine disorder: Secondary | ICD-10-CM

## 2022-07-17 DIAGNOSIS — E559 Vitamin D deficiency, unspecified: Secondary | ICD-10-CM

## 2022-07-17 DIAGNOSIS — E611 Iron deficiency: Secondary | ICD-10-CM

## 2022-07-17 DIAGNOSIS — I10 Essential (primary) hypertension: Secondary | ICD-10-CM | POA: Diagnosis not present

## 2022-07-17 DIAGNOSIS — F32A Depression, unspecified: Secondary | ICD-10-CM

## 2022-07-17 DIAGNOSIS — J309 Allergic rhinitis, unspecified: Secondary | ICD-10-CM | POA: Diagnosis not present

## 2022-07-17 DIAGNOSIS — E538 Deficiency of other specified B group vitamins: Secondary | ICD-10-CM | POA: Diagnosis not present

## 2022-07-17 DIAGNOSIS — R7303 Prediabetes: Secondary | ICD-10-CM | POA: Diagnosis not present

## 2022-07-17 MED ORDER — ESCITALOPRAM OXALATE 20 MG PO TABS
20.0000 mg | ORAL_TABLET | Freq: Every day | ORAL | 3 refills | Status: DC
Start: 2022-07-17 — End: 2023-09-02
  Filled 2022-07-17 – 2022-08-20 (×2): qty 90, 90d supply, fill #0
  Filled 2022-11-21 (×2): qty 90, 90d supply, fill #1
  Filled 2023-02-20: qty 90, 90d supply, fill #2
  Filled 2023-05-27: qty 90, 90d supply, fill #3

## 2022-07-17 MED ORDER — BUPROPION HCL ER (XL) 150 MG PO TB24
150.0000 mg | ORAL_TABLET | Freq: Every day | ORAL | 3 refills | Status: DC
Start: 2022-07-17 — End: 2023-09-03
  Filled 2022-07-17 – 2022-10-03 (×2): qty 90, 90d supply, fill #0
  Filled 2022-12-28: qty 90, 90d supply, fill #1
  Filled 2023-03-22: qty 90, 90d supply, fill #2
  Filled 2023-06-19: qty 90, 90d supply, fill #3

## 2022-07-17 MED ORDER — ALBUTEROL SULFATE HFA 108 (90 BASE) MCG/ACT IN AERS
1.0000 | INHALATION_SPRAY | Freq: Four times a day (QID) | RESPIRATORY_TRACT | 2 refills | Status: AC | PRN
Start: 2022-07-17 — End: ?
  Filled 2022-07-17: qty 6.7, 25d supply, fill #0

## 2022-07-17 MED ORDER — CHOLECALCIFEROL 1.25 MG (50000 UT) PO CAPS
50000.0000 [IU] | ORAL_CAPSULE | ORAL | 1 refills | Status: DC
Start: 2022-07-17 — End: 2023-10-02
  Filled 2022-07-17: qty 13, 90d supply, fill #0
  Filled 2022-08-20: qty 13, 91d supply, fill #0

## 2022-07-17 NOTE — Assessment & Plan Note (Signed)
Noted last year in lab work. Will check fasting lipids. Encouraged healthy diet and exercise. The 10-year ASCVD risk score (Arnett DK, et al., 2019) is: 1.3%.

## 2022-07-17 NOTE — Assessment & Plan Note (Signed)
Chronic. Has not been taking iron supplement. Thought to have been due to heavy menstrual periods, she has since had an ablation. Will check iron panel today.

## 2022-07-17 NOTE — Assessment & Plan Note (Addendum)
Chronic. Patient not taking any medication. Used to be on Norvasc 2.5 mg daily. Elevated reading x 2 today in office. She will start checking her blood pressure daily at home and keep a log to bring with her to her next appointment. Counseled on decreasing sodium intake. Lab work as outlined. Will continue to monitor.

## 2022-07-17 NOTE — Assessment & Plan Note (Addendum)
Encouraged healthy diet and exercise. Patient is trying to lose weight. 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 Trying to improve diet, decreasing carbs and increasing protein. Discussed health benefits of weight loss.

## 2022-07-17 NOTE — Assessment & Plan Note (Signed)
Chronic. A1c last year- 6.0. Will recheck A1c. Encouraged healthy diet and exercise.

## 2022-07-17 NOTE — Assessment & Plan Note (Addendum)
Worsening symptoms. PHQ- 8 and GAD- 6 today. Denies SI/HI. Will increase Lexapro to 20 mg daily. She will continue Wellbutrin XL 150 mg daily. She will follow up in one month. Counseled patient on common side effects. Encouraged to contact if worsening symptoms, unusual behavior changes or suicidal thoughts occur. Will continue to monitor.

## 2022-07-17 NOTE — Progress Notes (Signed)
Bethanie Dicker, NP-C Phone: [phone]  Kimberly [name] is a 47 y.o. female who presents today for transfer of care.   HYPERTENSION Disease Monitoring Home BP Monitoring- Not checking, has cuff at home Chest pain- No    Dyspnea- No Medications Compliance-  None. Lightheadedness-  No  Edema- No BMET    Component Value Date/Time   NA 138 04/26/2021 0930   K 4.0 04/26/2021 0930   CL 104 04/26/2021 0930   CO2 24 04/26/2021 0930   GLUCOSE 89 04/26/2021 0930   BUN 8 04/26/2021 0930   CREATININE 0.66 04/26/2021 0930   CREATININE 0.59 06/07/2017 0802   CALCIUM 9.3 04/26/2021 0930   GFRNONAA >60 10/26/2015 1649   GFRAA >60 10/26/2015 1649   HYPERLIPIDEMIA Symptoms Chest pain on exertion:  No   Leg claudication:   No Medications: Compliance- None Right upper quadrant pain- No  Muscle aches- No Lipid Panel     Component Value Date/Time   CHOL 234 (H) 04/26/2021 0930   TRIG 185.0 (H) 04/26/2021 0930   HDL 55.70 04/26/2021 0930   CHOLHDL 4 04/26/2021 0930   VLDL 37.0 04/26/2021 0930   LDLCALC 142 (H) 04/26/2021 0930   LDLCALC 120 (H) 06/07/2017 0802   Anxiety/Depression- Patient with increased mood swings and irritability. Currently on Wellbutrin XL 150 mg daily and Lexapro 10 mg daily. She is open to increasing her medication. PHQ- 8 and GAD- 6. Denies SI/HI. She does not see Psychiatry or a therapist.    Social History   Tobacco Use  Smoking Status Former   Packs/day: 0.25   Years: 1.50   Additional pack years: 0.00   Total pack years: 0.38   Types: Cigarettes   Quit date: 29   Years since quitting: 27.5  Smokeless Tobacco Never    Current Outpatient Medications on File Prior to Visit  Medication Sig Dispense Refill   cetirizine (ZYRTEC) 10 MG tablet Take 10 mg by mouth daily.     fluticasone (FLONASE) 50 MCG/ACT nasal spray Place 1-2 sprays into both nostrils daily. Max 2 sprays each nostril 16 g 0   No current facility-administered medications on file prior  to visit.    ROS see history of present illness  Objective  Physical Exam Vitals:   07/17/22 1526 07/17/22 1628  BP: (!) 144/80 (!) 140/80  Pulse: (!) 108   Temp: 98.2 F (36.8 C)   SpO2: 97%     BP Readings from Last 3 Encounters:  07/17/22 (!) 140/80  04/26/21 130/86  04/06/21 124/80   Wt Readings from Last 3 Encounters:  07/17/22 285 lb 9.6 oz (129.5 kg)  04/26/21 270 lb 3.2 oz (122.6 kg)  04/06/21 268 lb 9.6 oz (121.8 kg)    Physical Exam Constitutional:      General: She is not in acute distress.    Appearance: Normal appearance.  HENT:     Head: Normocephalic.  Cardiovascular:     Rate and Rhythm: Normal rate and regular rhythm.     Heart sounds: Normal heart sounds.  Pulmonary:     Effort: Pulmonary effort is normal.     Breath sounds: Normal breath sounds.  Skin:    General: Skin is warm and dry.  Neurological:     General: No focal deficit present.     Mental Status: She is alert.  Psychiatric:        Mood and Affect: Mood normal.        Behavior: Behavior normal.    Assessment/Plan:  Please see individual problem list.  Essential hypertension Assessment & Plan: Chronic. Patient not taking any medication. Used to be on Norvasc 2.5 mg daily. Elevated reading x 2 today in office. She will start checking her blood pressure daily at home and keep a log to bring with her to her next appointment. Counseled on decreasing sodium intake. Lab work as outlined. Will continue to monitor.   Orders: -     CBC with Differential/Platelet; Future -     Comprehensive metabolic panel; Future  Hyperlipidemia, unspecified hyperlipidemia type Assessment & Plan: Noted last year in lab work. Will check fasting lipids. Encouraged healthy diet and exercise. The 10-year ASCVD risk score (Arnett DK, et al., 2019) is: 1.3%.   Orders: -     Lipid panel; Future  Anxiety and depression Assessment & Plan: Worsening symptoms. PHQ- 8 and GAD- 6 today. Denies SI/HI. Will  increase Lexapro to 20 mg daily. She will continue Wellbutrin XL 150 mg daily. She will follow up in one month. Counseled patient on common side effects. Encouraged to contact if worsening symptoms, unusual behavior changes or suicidal thoughts occur. Will continue to monitor.   Orders: -     buPROPion HCl ER (XL); Take 1 tablet (150 mg total) by mouth daily.  Dispense: 90 tablet; Refill: 3 -     Escitalopram Oxalate; Take 1 tablet (20 mg total) by mouth daily.  Dispense: 90 tablet; Refill: 3  Prediabetes Assessment & Plan: Chronic. A1c last year- 6.0. Will recheck A1c. Encouraged healthy diet and exercise.   Orders: -     Hemoglobin A1c; Future  Obesity, Class III, BMI 40-49.9 (morbid obesity) (HCC) Assessment & Plan: Encouraged healthy diet and exercise. Patient is trying to lose weight. 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 Trying to improve diet, decreasing carbs and increasing protein. Discussed health benefits of weight loss.    Vitamin D deficiency Assessment & Plan: Chronic. Inconsistent about taking vitamin D supplement. Refills sent. Will check vitamin D level.   Orders: -     Cholecalciferol; Take 1 capsule (50,000 Units total) by mouth once a week.  Dispense: 13 capsule; Refill: 1 -     VITAMIN D 25 Hydroxy (Vit-D Deficiency, Fractures); Future  Iron deficiency Assessment & Plan: Chronic. Has not been taking iron supplement. Thought to have been due to heavy menstrual periods, she has since had an ablation. Will check iron panel today.   Orders: -     IBC + Ferritin; Future  Allergic rhinitis, unspecified seasonality, unspecified trigger -     Albuterol Sulfate HFA; Inhale 1-2 puffs into the lungs every 6 (six) hours as needed for wheezing or shortness of breath  Dispense: 6.7 g; Refill: 2  Thyroid disorder screen -     TSH; Future  B12 deficiency -     Vitamin B12; Future    Return in about 4 weeks (around 08/14/2022) for Follow  up.   Bethanie Dicker, NP-C [facility] - [employer]

## 2022-07-17 NOTE — Assessment & Plan Note (Signed)
Chronic. Inconsistent about taking vitamin D supplement. Refills sent. Will check vitamin D level.

## 2022-07-25 ENCOUNTER — Encounter: Payer: Self-pay | Admitting: *Deleted

## 2022-07-27 ENCOUNTER — Other Ambulatory Visit: Payer: Commercial Managed Care - PPO

## 2022-07-31 ENCOUNTER — Other Ambulatory Visit: Payer: Self-pay

## 2022-08-14 ENCOUNTER — Ambulatory Visit: Payer: Commercial Managed Care - PPO | Admitting: Nurse Practitioner

## 2022-08-20 ENCOUNTER — Other Ambulatory Visit: Payer: Self-pay

## 2022-10-01 ENCOUNTER — Telehealth: Payer: Commercial Managed Care - PPO | Admitting: Physician Assistant

## 2022-10-01 DIAGNOSIS — R3989 Other symptoms and signs involving the genitourinary system: Secondary | ICD-10-CM | POA: Diagnosis not present

## 2022-10-01 MED ORDER — NITROFURANTOIN MONOHYD MACRO 100 MG PO CAPS
100.0000 mg | ORAL_CAPSULE | Freq: Two times a day (BID) | ORAL | 0 refills | Status: DC
Start: 2022-10-01 — End: 2023-10-02

## 2022-10-01 NOTE — Progress Notes (Signed)

## 2022-10-03 ENCOUNTER — Other Ambulatory Visit: Payer: Self-pay

## 2022-11-21 ENCOUNTER — Ambulatory Visit: Payer: Commercial Managed Care - PPO | Admitting: Nurse Practitioner

## 2022-11-21 ENCOUNTER — Other Ambulatory Visit: Payer: Self-pay

## 2022-12-05 ENCOUNTER — Ambulatory Visit: Payer: Commercial Managed Care - PPO | Admitting: Nurse Practitioner

## 2022-12-05 NOTE — Progress Notes (Deleted)
  Bethanie Dicker, NP-C Phone: [phone]  Kimberly [name] is a 47 y.o. female who presents today for follow up.   ***  Social History   Tobacco Use  Smoking Status Former   Current packs/day: 0.00   Average packs/day: 0.3 packs/day for 1.5 years (0.4 ttl pk-yrs)   Types: Cigarettes   Start date: 07/1993   Quit date: 1997   Years since quitting: 27.9  Smokeless Tobacco Never    Current Outpatient Medications on File Prior to Visit  Medication Sig Dispense Refill   albuterol (VENTOLIN HFA) 108 (90 Base) MCG/ACT inhaler Inhale 1-2 puffs into the lungs every 6 (six) hours as needed for wheezing or shortness of breath 6.7 g 2   buPROPion (WELLBUTRIN XL) 150 MG 24 hr tablet Take 1 tablet (150 mg total) by mouth daily. 90 tablet 3   cetirizine (ZYRTEC) 10 MG tablet Take 10 mg by mouth daily.     Cholecalciferol 1.25 MG (50000 UT) capsule Take 1 capsule (50,000 Units total) by mouth once a week. 13 capsule 1   escitalopram (LEXAPRO) 20 MG tablet Take 1 tablet (20 mg total) by mouth daily. 90 tablet 3   fluticasone (FLONASE) 50 MCG/ACT nasal spray Place 1-2 sprays into both nostrils daily. Max 2 sprays each nostril 16 g 0   nitrofurantoin, macrocrystal-monohydrate, (MACROBID) 100 MG capsule Take 1 capsule (100 mg total) by mouth 2 (two) times daily. 10 capsule 0   No current facility-administered medications on file prior to visit.     ROS see history of present illness  Objective  Physical Exam There were no vitals filed for this visit.  BP Readings from Last 3 Encounters:  07/17/22 (!) 140/80  04/26/21 130/86  04/06/21 124/80   Wt Readings from Last 3 Encounters:  07/17/22 285 lb 9.6 oz (129.5 kg)  04/26/21 270 lb 3.2 oz (122.6 kg)  04/06/21 268 lb 9.6 oz (121.8 kg)    Physical Exam   Assessment/Plan: Please see individual problem list.  There are no diagnoses linked to this encounter.   Health Maintenance: ***  No follow-ups on file.   Bethanie Dicker,  NP-C [facility] - [employer]

## 2022-12-27 ENCOUNTER — Ambulatory Visit: Payer: Commercial Managed Care - PPO | Admitting: Nurse Practitioner

## 2023-02-20 ENCOUNTER — Other Ambulatory Visit: Payer: Self-pay

## 2023-08-15 ENCOUNTER — Encounter: Admitting: Nurse Practitioner

## 2023-09-02 ENCOUNTER — Other Ambulatory Visit: Payer: Self-pay

## 2023-09-02 ENCOUNTER — Other Ambulatory Visit: Payer: Self-pay | Admitting: Nurse Practitioner

## 2023-09-02 DIAGNOSIS — F419 Anxiety disorder, unspecified: Secondary | ICD-10-CM

## 2023-09-02 MED ORDER — ESCITALOPRAM OXALATE 20 MG PO TABS
20.0000 mg | ORAL_TABLET | Freq: Every day | ORAL | 3 refills | Status: AC
  Filled 2023-09-02: qty 90, 90d supply, fill #0
  Filled 2023-12-19 – 2023-12-30 (×2): qty 90, 90d supply, fill #1

## 2023-09-03 ENCOUNTER — Other Ambulatory Visit: Payer: Self-pay | Admitting: Nurse Practitioner

## 2023-09-03 ENCOUNTER — Other Ambulatory Visit: Payer: Self-pay

## 2023-09-03 DIAGNOSIS — F419 Anxiety disorder, unspecified: Secondary | ICD-10-CM

## 2023-09-03 MED FILL — Bupropion HCl Tab ER 24HR 150 MG: ORAL | 90 days supply | Qty: 90 | Fill #0 | Status: CN

## 2023-09-04 ENCOUNTER — Other Ambulatory Visit: Payer: Self-pay

## 2023-09-06 ENCOUNTER — Telehealth: Admitting: Physician Assistant

## 2023-09-06 DIAGNOSIS — B9689 Other specified bacterial agents as the cause of diseases classified elsewhere: Secondary | ICD-10-CM

## 2023-09-06 DIAGNOSIS — J019 Acute sinusitis, unspecified: Secondary | ICD-10-CM | POA: Diagnosis not present

## 2023-09-06 MED ORDER — AMOXICILLIN-POT CLAVULANATE 875-125 MG PO TABS: 1.0000 | ORAL_TABLET | Freq: Two times a day (BID) | ORAL | 0 refills | Status: DC

## 2023-09-06 NOTE — Progress Notes (Signed)

## 2023-10-02 ENCOUNTER — Other Ambulatory Visit: Payer: Self-pay

## 2023-10-02 ENCOUNTER — Ambulatory Visit: Admitting: Nurse Practitioner

## 2023-10-02 VITALS — BP 158/108 | HR 94 | Temp 97.9°F | Ht 64.0 in | Wt 303.8 lb

## 2023-10-02 DIAGNOSIS — Z0001 Encounter for general adult medical examination with abnormal findings: Secondary | ICD-10-CM | POA: Diagnosis not present

## 2023-10-02 DIAGNOSIS — F419 Anxiety disorder, unspecified: Secondary | ICD-10-CM

## 2023-10-02 DIAGNOSIS — R519 Headache, unspecified: Secondary | ICD-10-CM | POA: Diagnosis not present

## 2023-10-02 DIAGNOSIS — R7303 Prediabetes: Secondary | ICD-10-CM | POA: Diagnosis not present

## 2023-10-02 DIAGNOSIS — E559 Vitamin D deficiency, unspecified: Secondary | ICD-10-CM

## 2023-10-02 DIAGNOSIS — I10 Essential (primary) hypertension: Secondary | ICD-10-CM

## 2023-10-02 DIAGNOSIS — Z23 Encounter for immunization: Secondary | ICD-10-CM | POA: Diagnosis not present

## 2023-10-02 DIAGNOSIS — M25562 Pain in left knee: Secondary | ICD-10-CM | POA: Diagnosis not present

## 2023-10-02 DIAGNOSIS — Z1231 Encounter for screening mammogram for malignant neoplasm of breast: Secondary | ICD-10-CM

## 2023-10-02 DIAGNOSIS — M1712 Unilateral primary osteoarthritis, left knee: Secondary | ICD-10-CM | POA: Diagnosis not present

## 2023-10-02 DIAGNOSIS — Z1211 Encounter for screening for malignant neoplasm of colon: Secondary | ICD-10-CM

## 2023-10-02 DIAGNOSIS — E66813 Obesity, class 3: Secondary | ICD-10-CM

## 2023-10-02 DIAGNOSIS — G4700 Insomnia, unspecified: Secondary | ICD-10-CM

## 2023-10-02 DIAGNOSIS — Z6841 Body Mass Index (BMI) 40.0 and over, adult: Secondary | ICD-10-CM

## 2023-10-02 DIAGNOSIS — F32A Depression, unspecified: Secondary | ICD-10-CM

## 2023-10-02 DIAGNOSIS — E785 Hyperlipidemia, unspecified: Secondary | ICD-10-CM | POA: Diagnosis not present

## 2023-10-02 LAB — COMPREHENSIVE METABOLIC PANEL WITH GFR
ALT: 37 U/L — ABNORMAL HIGH (ref 0–35)
AST: 23 U/L (ref 0–37)
Albumin: 4.1 g/dL (ref 3.5–5.2)
Alkaline Phosphatase: 50 U/L (ref 39–117)
BUN: 11 mg/dL (ref 6–23)
CO2: 22 meq/L (ref 19–32)
Calcium: 9.2 mg/dL (ref 8.4–10.5)
Chloride: 106 meq/L (ref 96–112)
Creatinine, Ser: 0.59 mg/dL (ref 0.40–1.20)
GFR: 107.07 mL/min (ref >60.00)
Glucose, Bld: 88 mg/dL (ref 70–99)
Potassium: 4.3 meq/L (ref 3.5–5.1)
Sodium: 139 meq/L (ref 135–145)
Total Bilirubin: 0.4 mg/dL (ref 0.2–1.2)
Total Protein: 6.8 g/dL (ref 6.0–8.3)

## 2023-10-02 LAB — CBC WITH DIFFERENTIAL/PLATELET
Basophils Absolute: 0 K/uL (ref 0.0–0.1)
Basophils Relative: 0.8 % (ref 0.0–3.0)
Eosinophils Absolute: 0.5 K/uL (ref 0.0–0.7)
Eosinophils Relative: 8.4 % — ABNORMAL HIGH (ref 0.0–5.0)
HCT: 44.6 % (ref 36.0–46.0)
Hemoglobin: 14.7 g/dL (ref 12.0–15.0)
Lymphocytes Relative: 35.9 % (ref 12.0–46.0)
Lymphs Abs: 2.2 K/uL (ref 0.7–4.0)
MCHC: 32.9 g/dL (ref 30.0–36.0)
MCV: 85.1 fl (ref 78.0–100.0)
Monocytes Absolute: 0.4 K/uL (ref 0.1–1.0)
Monocytes Relative: 6.8 % (ref 3.0–12.0)
Neutro Abs: 3 K/uL (ref 1.4–7.7)
Neutrophils Relative %: 48.1 % (ref 43.0–77.0)
Platelets: 229 K/uL (ref 150.0–400.0)
RBC: 5.25 Mil/uL — ABNORMAL HIGH (ref 3.87–5.11)
RDW: 14.3 % (ref 11.5–15.5)
WBC: 6.2 K/uL (ref 4.0–10.5)

## 2023-10-02 LAB — HEMOGLOBIN A1C: Hgb A1c MFr Bld: 6.4 % (ref 4.6–6.5)

## 2023-10-02 LAB — LIPID PANEL
Cholesterol: 244 mg/dL — ABNORMAL HIGH (ref 0–200)
HDL: 50.9 mg/dL (ref >39.00)
LDL Cholesterol: 161 mg/dL — ABNORMAL HIGH (ref 0–99)
NonHDL: 193.23
Total CHOL/HDL Ratio: 5
Triglycerides: 161 mg/dL — ABNORMAL HIGH (ref 0.0–149.0)
VLDL: 32.2 mg/dL (ref 0.0–40.0)

## 2023-10-02 LAB — TSH: TSH: 0.82 u[IU]/mL (ref 0.35–5.50)

## 2023-10-02 LAB — VITAMIN D 25 HYDROXY (VIT D DEFICIENCY, FRACTURES): VITD: 24.76 ng/mL — ABNORMAL LOW (ref 30.00–100.00)

## 2023-10-02 MED ORDER — METHYLPREDNISOLONE 4 MG PO TBPK
ORAL_TABLET | ORAL | 0 refills | Status: AC
  Filled 2023-10-02: qty 21, 6d supply, fill #0

## 2023-10-02 MED ORDER — TRAZODONE HCL 50 MG PO TABS
25.0000 mg | ORAL_TABLET | Freq: Every evening | ORAL | 3 refills | Status: AC | PRN
  Filled 2023-10-02: qty 45, 45d supply, fill #0
  Filled 2023-11-20: qty 45, 45d supply, fill #1
  Filled 2024-02-04: qty 45, 45d supply, fill #2

## 2023-10-02 MED ORDER — AMLODIPINE BESYLATE 5 MG PO TABS
5.0000 mg | ORAL_TABLET | Freq: Every day | ORAL | 0 refills | Status: DC
  Filled 2023-10-02: qty 90, 90d supply, fill #0

## 2023-10-02 MED ORDER — BUPROPION HCL ER (XL) 300 MG PO TB24
300.0000 mg | ORAL_TABLET | Freq: Every day | ORAL | 3 refills | Status: AC
  Filled 2023-10-02: qty 90, 90d supply, fill #0
  Filled 2023-12-30: qty 90, 90d supply, fill #1

## 2023-10-02 MED ORDER — SUMATRIPTAN SUCCINATE 50 MG PO TABS
ORAL_TABLET | ORAL | 5 refills | Status: AC
  Filled 2023-10-02: qty 10, 30d supply, fill #0
  Filled 2023-11-12 – 2023-11-20 (×2): qty 10, 30d supply, fill #1
  Filled 2023-12-19 – 2023-12-30 (×2): qty 10, 30d supply, fill #2
  Filled 2024-02-04: qty 10, 30d supply, fill #3

## 2023-10-02 NOTE — Progress Notes (Unsigned)
 Leron Glance, NP-C Phone: [phone]  Kimberly [name] is a 48 y.o. female who presents today for annual exam.   Discussed the use of AI scribe software for clinical note transcription with the patient, who gave verbal consent to proceed.  History of Present Illness   Kimberly [name] is a 48 year old female with hypertension and prediabetes who presents for an annual physical exam and concerns about weight gain and headaches.  She experiences frequent headaches located in her temples, with associated right eye watering and white discharge by the end of the day. Despite using eye drops for allergies, symptoms persist. She spends 9-10 hours daily on the computer. Her headaches occur before her menstrual period and last about a week. Ibuprofen  and Tylenol  have been ineffective and cause fatigue. She has not tried Aleve yet.  She has gained approximately 30 pounds since last year, despite not increasing her food intake and sometimes eating less. She has attempted calorie counting and acknowledges decreased physical activity due to a sedentary job and busy lifestyle. Her diet includes peanut butter toast for breakfast, leftovers or chicken salad for lunch, and dinners often include red meat or chicken. She drinks a lot of diet Pepsi and 2-3 bottles of water daily, with a high intake of carbohydrates such as breads, pastas, and potatoes.  She has a history of hypertension and was previously on amlodipine  but stopped taking it. She has noticed fluctuating blood pressure and experiences shortness of breath during physical activity. No chest pain, dizziness, or leg swelling. She has not been regularly checking her blood pressure recently.  She has a left knee injury for which she takes ibuprofen  and is seeing an orthopedic specialist. She believes this may contribute to her elevated blood pressure.  She has a history of prediabetes and is concerned about her weight gain. She is interested in weight loss  assistance.  She reports mood fluctuations, feeling 'blah' and sometimes irritable, especially around hormonal times. She is currently on Wellbutrin  150 mg and Lexapro  20 mg and is considering increasing her Wellbutrin  dosage.  She has trouble staying asleep, often waking up during the night. She sometimes takes trazodone , which helps her sleep. Melatonin only helps her fall asleep, not stay asleep.      Social History   Tobacco Use  Smoking Status Former  . Current packs/day: 0.00  . Average packs/day: 0.3 packs/day for 1.5 years (0.4 ttl pk-yrs)  . Types: Cigarettes  . Start date: 07/1993  . Quit date: 41  . Years since quitting: 28.7  Smokeless Tobacco Never    Current Outpatient Medications on File Prior to Visit  Medication Sig Dispense Refill  . albuterol  (VENTOLIN  HFA) 108 (90 Base) MCG/ACT inhaler Inhale 1-2 puffs into the lungs every 6 (six) hours as needed for wheezing or shortness of breath 6.7 g 2  . cetirizine (ZYRTEC) 10 MG tablet Take 10 mg by mouth daily.    . escitalopram  (LEXAPRO ) 20 MG tablet Take 1 tablet (20 mg total) by mouth daily. 90 tablet 3   No current facility-administered medications on file prior to visit.     ROS see history of present illness  Objective  Physical Exam Vitals:   10/02/23 1058 10/02/23 1137  BP: (!) 156/109 (!) 158/108  Pulse: 94   Temp: 97.9 F (36.6 C)   SpO2: 96%     BP Readings from Last 3 Encounters:  10/02/23 (!) 158/108  07/17/22 (!) 140/80  04/26/21 130/86   Wt Readings  from Last 3 Encounters:  10/02/23 (!) 303 lb 12.8 oz (137.8 kg)  07/17/22 285 lb 9.6 oz (129.5 kg)  04/26/21 270 lb 3.2 oz (122.6 kg)    Physical Exam Constitutional:      General: She is not in acute distress.    Appearance: Normal appearance. She is obese.  HENT:     Head: Normocephalic.     Right Ear: Tympanic membrane normal.     Left Ear: Tympanic membrane normal.     Nose: Nose normal.     Mouth/Throat:     Mouth: Mucous  membranes are moist.     Pharynx: Oropharynx is clear.  Eyes:     Conjunctiva/sclera: Conjunctivae normal.     Pupils: Pupils are equal, round, and reactive to light.  Neck:     Thyroid: No thyromegaly.  Cardiovascular:     Rate and Rhythm: Normal rate and regular rhythm.     Heart sounds: Normal heart sounds.  Pulmonary:     Effort: Pulmonary effort is normal.     Breath sounds: Normal breath sounds.  Abdominal:     General: Abdomen is flat. Bowel sounds are normal.     Palpations: Abdomen is soft. There is no mass.     Tenderness: There is no abdominal tenderness.  Musculoskeletal:        General: Normal range of motion.  Lymphadenopathy:     Cervical: No cervical adenopathy.  Skin:    General: Skin is warm and dry.     Findings: No rash.  Neurological:     General: No focal deficit present.     Mental Status: She is alert.  Psychiatric:        Mood and Affect: Mood normal.        Behavior: Behavior normal.      Assessment/Plan: Please see individual problem list.  Assessment and Plan    Essential hypertension   Blood pressure is significantly elevated, likely due to recent weight gain. She reports shortness of breath with exertion but no chest pain, dizziness, or leg swelling. Restart amlodipine  (Norvasc ) and monitor blood pressure regularly. Follow up in six weeks for a blood pressure check.  Obesity, class 3   She has gained 30 pounds since last year, with a sedentary lifestyle and high carbohydrate intake. Motivated to lose weight, Zepbound  may offer fewer side effects and better efficacy. Nausea is common with dose increases or greasy foods. Verify insurance coverage. Check A1c to guide the treatment plan. Initiate Zepbound  for weight loss if labs are appropriate. Discuss dietary changes to reduce carbohydrates and increase protein, and encourage regular physical activity. Follow up in six weeks to assess weight loss progress and medication tolerance.  Headache,  likely migraine variant   Recurrent headaches are localized to the temples, possibly hormonal, with light sensitivity and occasional eye watering. History of cluster headaches suggests a differential diagnosis of migraine and cluster headache. Prescribe Imitrex  for acute headache management and monitor headache frequency and response to medication. Follow up if headaches persist or worsen.  Prediabetes   There is concern for progression due to recent weight gain. Order an A1c test to assess current glycemic status.  Anxiety and mood symptoms   She experiences mood fluctuations, irritability, and feeling 'blah'. Currently on Wellbutrin  and Lexapro , increasing Wellbutrin  may stabilize mood and requires monitoring. Increase Wellbutrin  dosage from 150 mg to 300 mg and monitor mood symptoms and medication effects. Follow up in six weeks to assess mood and medication efficacy.  Insomnia, difficulty maintaining sleep   She has difficulty staying asleep and occasionally uses trazodone  effectively for sleep maintenance. Consider trazodone  as needed and discuss sleep hygiene practices.  General Health Maintenance   She is due for a Pap smear and mammogram and discussed colon cancer screening options. Up to date on flu and COVID vaccinations, with no smoking or alcohol use. Follow up with OBGYN for Pap smear, schedule mammogram, and opt for Cologuard for colon cancer screening. Continue routine dental and eye exams.  Follow-Up   Plan to reassess multiple health concerns and treatment efficacy in six weeks. Schedule a follow-up appointment in six weeks to reassess blood pressure, weight, mood, and sleep.       Encounter for routine adult health examination with abnormal findings  Essential hypertension -     CBC with Differential/Platelet -     Comprehensive metabolic panel with GFR -     amLODIPine  Besylate; Take 1 tablet (5 mg total) by mouth daily.  Dispense: 90 tablet; Refill: 0  Anxiety and  depression -     TSH -     buPROPion  HCl ER (XL); Take 1 tablet (300 mg total) by mouth daily.  Dispense: 90 tablet; Refill: 3  Insomnia, unspecified type -     traZODone  HCl; Take 0.5-1 tablets (25-50 mg total) by mouth at bedtime as needed for sleep.  Dispense: 45 tablet; Refill: 3  Headache disorder -     SUMAtriptan  Succinate; Take 1 tablet by mouth at onset of headache. May repeat in 2 hours if headache persists or recurs.  Dispense: 10 tablet; Refill: 5  Class 3 severe obesity due to excess calories with serious comorbidity and body mass index (BMI) of 50.0 to 59.9 in adult  Prediabetes -     Hemoglobin A1c  Hyperlipidemia, unspecified hyperlipidemia type -     Lipid panel  Vitamin D  deficiency -     VITAMIN D  25 Hydroxy (Vit-D Deficiency, Fractures)  Screen for colon cancer -     Cologuard  Screening mammogram for breast cancer -     3D Screening Mammogram, Left and Right; Future  Need for tetanus, diphtheria, and acellular pertussis (Tdap) vaccine -     Tdap vaccine greater than or equal to 7yo IM      Return in about 6 weeks (around 11/13/2023) for Follow up.   Leron Glance, NP-C [facility] - [facility]

## 2023-10-02 NOTE — Patient Instructions (Signed)
 YOUR MAMMOGRAM IS DUE, PLEASE CALL AND GET THIS SCHEDULED! [facility] - call [phone]

## 2023-10-04 ENCOUNTER — Encounter: Payer: Self-pay | Admitting: Nurse Practitioner

## 2023-10-04 ENCOUNTER — Ambulatory Visit: Payer: Self-pay | Admitting: Nurse Practitioner

## 2023-10-04 ENCOUNTER — Other Ambulatory Visit: Payer: Self-pay

## 2023-10-04 DIAGNOSIS — Z6841 Body Mass Index (BMI) 40.0 and over, adult: Secondary | ICD-10-CM

## 2023-10-04 MED ORDER — ZEPBOUND 2.5 MG/0.5ML ~~LOC~~ SOAJ
2.5000 mg | SUBCUTANEOUS | 0 refills | Status: DC
  Filled 2023-10-04: qty 2, 28d supply, fill #0

## 2023-10-04 MED ORDER — ZEPBOUND 5 MG/0.5ML ~~LOC~~ SOAJ
5.0000 mg | SUBCUTANEOUS | 0 refills | Status: DC
  Filled 2023-10-04: qty 2, 28d supply, fill #0

## 2023-10-04 NOTE — Telephone Encounter (Signed)
 Noted

## 2023-10-07 ENCOUNTER — Encounter: Payer: Self-pay | Admitting: Nurse Practitioner

## 2023-10-10 ENCOUNTER — Telehealth: Payer: Self-pay

## 2023-10-10 ENCOUNTER — Other Ambulatory Visit (HOSPITAL_COMMUNITY): Payer: Self-pay

## 2023-10-10 NOTE — Telephone Encounter (Signed)
 PA needed for Zepbound  and Wegovy to see if either one is covered or if any weight loss meds are covered

## 2023-10-10 NOTE — Telephone Encounter (Signed)
 Patient has insurance through [employer]. GLP-1's for weight loss are excluded from coverage.

## 2023-10-18 ENCOUNTER — Encounter: Payer: Self-pay | Admitting: Nurse Practitioner

## 2023-10-18 DIAGNOSIS — Z0001 Encounter for general adult medical examination with abnormal findings: Secondary | ICD-10-CM | POA: Insufficient documentation

## 2023-10-18 DIAGNOSIS — R519 Headache, unspecified: Secondary | ICD-10-CM | POA: Insufficient documentation

## 2023-10-18 NOTE — Assessment & Plan Note (Addendum)
 There is concern for progression due to recent weight gain. Check A1c to assess current glycemic status. Encourage healthy diet and regular exercise.

## 2023-10-18 NOTE — Assessment & Plan Note (Signed)
 Recurrent headaches are localized to the temples, possibly hormonal, with light sensitivity and occasional eye watering. History of cluster headaches suggests a differential diagnosis of migraine and cluster headache. Prescribe Imitrex  for acute headache management and monitor headache frequency and response to medication. Follow up if headaches persist or worsen.

## 2023-10-18 NOTE — Assessment & Plan Note (Addendum)
 Physical exam complete. We will check lab work as outlined. She is due for a Pap smear and mammogram and discussed colon cancer screening options. Up to date on flu vaccine, with no smoking, alcohol or drug use. Declines additional COVID vaccines. Tetanus vaccine administered today. Follow up with OBGYN for Pap smear, schedule mammogram, and opt for Cologuard for colon cancer screening. Orders placed. Continue routine dental and eye exams. Encourage healthy diet and regular exercise.

## 2023-10-18 NOTE — Assessment & Plan Note (Signed)
 She has difficulty staying asleep. She has tried trazodone  previously with success. Start trazodone  25-50 mg at bedtime as needed and discuss sleep hygiene practices.

## 2023-10-18 NOTE — Assessment & Plan Note (Addendum)
 She experiences mood fluctuations, irritability, and feeling 'blah'. Currently on Wellbutrin  and Lexapro , increasing Wellbutrin  may stabilize mood and requires monitoring. Increase Wellbutrin  XL dosage from 150 mg to 300 mg daily and monitor mood symptoms and medication effects. Continue Lexapro  20 mg daily. Follow up in six weeks to assess mood and medication efficacy. Encouraged to contact if worsening symptoms, unusual behavior changes or suicidal thoughts occur.

## 2023-10-18 NOTE — Assessment & Plan Note (Signed)
 She has gained 30 pounds since last year, with a sedentary lifestyle and high carbohydrate intake. Motivated to lose weight, Zepbound  may offer fewer side effects and better efficacy. Verify insurance coverage. Check A1c to guide the treatment plan. Initiate Zepbound  for weight loss if labs are appropriate. Counseled on the risk of pancreatitis and gallbladder disease. Discussed the risk of nausea. Advised to discontinue the Zepbound  and contact us  immediately if they develop abdominal pain. If they develop excessive nausea they will contact us  right away. I discussed that medullary thyroid cancer has been seen in rats studies. The patient confirmed no personal or family history of thyroid cancer, parathyroid cancer, or adrenal gland cancer. Discussed that we thus far have not seen medullary thyroid cancer result from use of this type of medication in humans. Advised to monitor the thyroid area and contact us  for any lumps, swelling, trouble swallowing, or any other changes in this area.  Discussed goal weight loss of 1 to 2 pounds a week while on this medication. Discussed the importance of healthy diet, exercise and lifestyle modifications even with this medication. Discuss dietary changes to reduce carbohydrates and increase protein, and encourage regular physical activity. Follow up in six weeks to assess weight loss progress and medication tolerance.

## 2023-10-18 NOTE — Assessment & Plan Note (Signed)
 Blood pressure is significantly elevated, likely due to recent weight gain. She reports shortness of breath with exertion but no chest pain, dizziness, or leg swelling. Restart amlodipine  (Norvasc ) 5 mg daily and monitor blood pressure regularly. Follow up in six weeks for a blood pressure check.

## 2023-11-12 ENCOUNTER — Other Ambulatory Visit: Payer: Self-pay

## 2023-11-12 ENCOUNTER — Encounter: Payer: Self-pay | Admitting: Pharmacist

## 2023-11-14 ENCOUNTER — Ambulatory Visit: Admitting: Nurse Practitioner

## 2023-11-14 ENCOUNTER — Other Ambulatory Visit: Payer: Self-pay

## 2023-11-14 VITALS — BP 137/84 | HR 91 | Temp 98.2°F | Ht 64.0 in | Wt 295.8 lb

## 2023-11-14 DIAGNOSIS — I10 Essential (primary) hypertension: Secondary | ICD-10-CM

## 2023-11-14 DIAGNOSIS — E559 Vitamin D deficiency, unspecified: Secondary | ICD-10-CM

## 2023-11-14 DIAGNOSIS — F419 Anxiety disorder, unspecified: Secondary | ICD-10-CM | POA: Diagnosis not present

## 2023-11-14 DIAGNOSIS — F32A Depression, unspecified: Secondary | ICD-10-CM | POA: Diagnosis not present

## 2023-11-14 DIAGNOSIS — E66813 Obesity, class 3: Secondary | ICD-10-CM | POA: Diagnosis not present

## 2023-11-14 DIAGNOSIS — R519 Headache, unspecified: Secondary | ICD-10-CM

## 2023-11-14 DIAGNOSIS — Z6841 Body Mass Index (BMI) 40.0 and over, adult: Secondary | ICD-10-CM

## 2023-11-14 DIAGNOSIS — G4700 Insomnia, unspecified: Secondary | ICD-10-CM | POA: Diagnosis not present

## 2023-11-14 MED ORDER — HYDROCHLOROTHIAZIDE 12.5 MG PO TABS
12.5000 mg | ORAL_TABLET | Freq: Every day | ORAL | 1 refills | Status: AC
  Filled 2023-11-14: qty 90, 90d supply, fill #0

## 2023-11-14 NOTE — Assessment & Plan Note (Signed)
 Blood pressure remains elevated at 140/99 mmHg despite amlodipine . Improvement noted on recheck. Add hydrochlorothiazide  12.5 mg daily. Advised to take in the morning. Continue amlodipine  5 mg daily. Schedule a follow-up blood pressure check with the nurse in two weeks. We will continue to monitor.

## 2023-11-14 NOTE — Progress Notes (Signed)
 Leron Glance, NP-C Phone: [phone]  Kimberly [name] is a 48 y.o. female who presents today for follow up.   Discussed the use of AI scribe software for clinical note transcription with the patient, who gave verbal consent to proceed.  History of Present Illness   Kimberly [name] is a 48 year old female with hypertension who presents for a six-week follow-up visit.  She has been taking amlodipine  daily at night due to its sedative effect. She does not regularly check her blood pressure at home. Previously, her blood pressure was 150/108 mmHg. No chest pain, shortness of breath, dizziness, or swelling.  She reports an improvement in her diet, eating less and monitoring her carbohydrate intake, resulting in an eight-pound weight loss since her last visit. She has not been exercising much due to a recent COVID-19 infection about two to three weeks ago, which affected her ability to maintain physical activity.  She is managing her mood with Wellbutrin , increased to 300 mg, and reports feeling much better. She is also taking Lexapro  and notes an improvement in her mood and energy levels. For sleep, she takes trazodone , usually one tablet, but sometimes half if she feels particularly tired.  She experiences headaches, particularly around her hormonal cycle, and uses Imitrex  as needed. She typically takes half a tablet in the morning and another half if needed later in the day. Imitrex  helps with her headaches, which she associates with hormonal changes.  She has started a vitamin D  supplement, which she feels has improved her energy and sleep. She also notes that Tylenol  and ibuprofen  make her sleepy.      Social History   Tobacco Use  Smoking Status Former   Current packs/day: 0.00   Average packs/day: 0.3 packs/day for 1.5 years (0.4 ttl pk-yrs)   Types: Cigarettes   Start date: 07/1993   Quit date: 1997   Years since quitting: 28.8  Smokeless Tobacco Never    Current Outpatient  Medications on File Prior to Visit  Medication Sig Dispense Refill   albuterol  (VENTOLIN  HFA) 108 (90 Base) MCG/ACT inhaler Inhale 1-2 puffs into the lungs every 6 (six) hours as needed for wheezing or shortness of breath 6.7 g 2   amLODipine  (NORVASC ) 5 MG tablet Take 1 tablet (5 mg total) by mouth daily. 90 tablet 0   buPROPion  (WELLBUTRIN  XL) 300 MG 24 hr tablet Take 1 tablet (300 mg total) by mouth daily. 90 tablet 3   cetirizine (ZYRTEC) 10 MG tablet Take 10 mg by mouth daily.     escitalopram  (LEXAPRO ) 20 MG tablet Take 1 tablet (20 mg total) by mouth daily. 90 tablet 3   SUMAtriptan  (IMITREX ) 50 MG tablet Take 1 tablet by mouth at onset of headache. May repeat in 2 hours if headache persists or recurs. 10 tablet 5   traZODone  (DESYREL ) 50 MG tablet Take 0.5-1 tablets (25-50 mg total) by mouth at bedtime as needed for sleep. 45 tablet 3   No current facility-administered medications on file prior to visit.     ROS see history of present illness  Objective  Physical Exam Vitals:   11/14/23 1610 11/14/23 1633  BP: (!) 140/99 137/84  Pulse: 91   Temp: 98.2 F (36.8 C)   SpO2: 97%     BP Readings from Last 3 Encounters:  11/14/23 137/84  10/02/23 (!) 158/108  07/17/22 (!) 140/80   Wt Readings from Last 3 Encounters:  11/14/23 295 lb 12.8 oz (134.2 kg)  10/02/23 ROLLEN)  303 lb 12.8 oz (137.8 kg)  07/17/22 285 lb 9.6 oz (129.5 kg)    Physical Exam Constitutional:      General: She is not in acute distress.    Appearance: Normal appearance. She is obese.  HENT:     Head: Normocephalic.  Cardiovascular:     Rate and Rhythm: Normal rate and regular rhythm.     Heart sounds: Normal heart sounds.  Pulmonary:     Effort: Pulmonary effort is normal.     Breath sounds: Normal breath sounds.  Skin:    General: Skin is warm and dry.  Neurological:     General: No focal deficit present.     Mental Status: She is alert.  Psychiatric:        Mood and Affect: Mood normal.         Behavior: Behavior normal.      Assessment/Plan: Please see individual problem list.   Essential hypertension Assessment & Plan: Blood pressure remains elevated at 140/99 mmHg despite amlodipine . Improvement noted on recheck. Add hydrochlorothiazide  12.5 mg daily. Advised to take in the morning. Continue amlodipine  5 mg daily. Schedule a follow-up blood pressure check with the nurse in two weeks. We will continue to monitor.    Orders: -     Basic metabolic panel with GFR; Future -     hydroCHLOROthiazide ; Take 1 tablet (12.5 mg total) by mouth daily.  Dispense: 90 tablet; Refill: 1  Class 3 severe obesity due to excess calories with serious comorbidity and body mass index (BMI) of 50.0 to 59.9 in adult [facility]) Assessment & Plan: She lost 8 pounds in six weeks through dietary changes. Exercise is limited post-COVID-19. Continued weight loss is encouraged for health and blood pressure benefits. Continue dietary modifications focusing on carbohydrate reduction and portion control. Encourage increased physical activity as tolerated. We will continue to monitor.    Insomnia, unspecified type Assessment & Plan: Well-managed with trazodone . She reports improved sleep quality and energy. Continue trazodone  at bedtime.    Anxiety and depression Assessment & Plan: Mood has improved with increased Wellbutrin . She reports feeling better overall. Continue Wellbutrin  XL 300 mg daily and Lexapro  20 mg daily. Encouraged to contact if worsening symptoms, unusual behavior changes or suicidal thoughts occur. We will continue to monitor.    Vitamin D  deficiency Assessment & Plan: Started on a vitamin D  supplement, she reports improved energy and sleep. Continue vitamin D  supplementation.   Headache disorder Assessment & Plan: Migraines are more frequent around hormonal changes. Imitrex  is effective as needed. Continue.       Return in about 2 weeks (around 11/28/2023) for Blood pressure check  with nursing and lab, then 3 month follow up.   Leron Glance, NP-C [facility]

## 2023-11-15 ENCOUNTER — Other Ambulatory Visit: Payer: Self-pay

## 2023-11-20 ENCOUNTER — Encounter: Payer: Self-pay | Admitting: Nurse Practitioner

## 2023-11-20 NOTE — Assessment & Plan Note (Signed)
 Well-managed with trazodone . She reports improved sleep quality and energy. Continue trazodone  at bedtime.

## 2023-11-20 NOTE — Assessment & Plan Note (Signed)
 She lost 8 pounds in six weeks through dietary changes. Exercise is limited post-COVID-19. Continued weight loss is encouraged for health and blood pressure benefits. Continue dietary modifications focusing on carbohydrate reduction and portion control. Encourage increased physical activity as tolerated. We will continue to monitor.

## 2023-11-20 NOTE — Assessment & Plan Note (Signed)
 Migraines are more frequent around hormonal changes. Imitrex  is effective as needed. Continue.

## 2023-11-20 NOTE — Assessment & Plan Note (Signed)
 Mood has improved with increased Wellbutrin . She reports feeling better overall. Continue Wellbutrin  XL 300 mg daily and Lexapro  20 mg daily. Encouraged to contact if worsening symptoms, unusual behavior changes or suicidal thoughts occur. We will continue to monitor.

## 2023-11-20 NOTE — Assessment & Plan Note (Signed)
 Started on a vitamin D  supplement, she reports improved energy and sleep. Continue vitamin D  supplementation.

## 2023-11-27 ENCOUNTER — Ambulatory Visit

## 2023-12-19 ENCOUNTER — Encounter: Payer: Self-pay | Admitting: Pharmacist

## 2023-12-19 ENCOUNTER — Other Ambulatory Visit: Payer: Self-pay

## 2023-12-20 ENCOUNTER — Other Ambulatory Visit (HOSPITAL_COMMUNITY): Payer: Self-pay

## 2023-12-24 ENCOUNTER — Telehealth: Admitting: Family Medicine

## 2023-12-24 ENCOUNTER — Other Ambulatory Visit: Payer: Self-pay

## 2023-12-24 DIAGNOSIS — H66001 Acute suppurative otitis media without spontaneous rupture of ear drum, right ear: Secondary | ICD-10-CM | POA: Diagnosis not present

## 2023-12-24 DIAGNOSIS — J069 Acute upper respiratory infection, unspecified: Secondary | ICD-10-CM

## 2023-12-24 DIAGNOSIS — J029 Acute pharyngitis, unspecified: Secondary | ICD-10-CM | POA: Diagnosis not present

## 2023-12-24 MED ORDER — PROMETHAZINE-DM 6.25-15 MG/5ML PO SYRP: 5.0000 mL | ORAL_SOLUTION | Freq: Four times a day (QID) | ORAL | 0 refills | Status: AC | PRN

## 2023-12-24 MED ORDER — LIDOCAINE VISCOUS HCL 2 % MT SOLN: 15.0000 mL | OROMUCOSAL | 0 refills | Status: AC | PRN

## 2023-12-24 MED ORDER — IPRATROPIUM BROMIDE 0.03 % NA SOLN: 2.0000 | Freq: Two times a day (BID) | NASAL | 0 refills | Status: AC

## 2023-12-24 NOTE — Progress Notes (Signed)
 We are sorry you are not feeling well.  Here is how we plan to help!  Based on what you have shared with me, it looks like you may have a viral upper respiratory infection.  Upper respiratory infections are caused by a large number of viruses; however, rhinovirus is the most common cause.   Symptoms vary from person to person, with common symptoms including sore throat, cough, and fatigue or lack of energy, and a feeling of general discomfort.  A low-grade fever of up to 100.4 may present, but is often uncommon.  Symptoms vary however, and are closely related to a person's age or underlying illnesses.  The most common symptoms associated with an upper respiratory infection are nasal discharge or congestion, cough, sneezing, headache and pressure in the ears and face.  These symptoms usually persist for about 3 to 10 days, but can last up to 2 weeks.  It is important to know that upper respiratory infections do not cause serious illness or complications in most cases.    Upper respiratory infections can be transmitted from person to person, with the most common method of transmission being a person's hands.  The virus is able to live on the skin and can infect other persons for up to 2 hours after direct contact.  Also, these can be transmitted when someone coughs or sneezes; thus, it is important to cover the mouth to reduce this risk.  To keep the spread of the illness at bay, good hand hygiene is very important!  Because this is a viral infection, there are no specific treatments other than to help you with the symptoms until the infection runs its course.    For nasal congestion, you may use an oral decongestants such as Mucinex  D or if you have glaucoma or high blood pressure use plain Mucinex .  Saline nasal spray or nasal drops can help and can safely be used as often as needed for congestion.  For your congestion, I have prescribed Ipratropium Bromide  nasal spray 0.03% two sprays in each nostril 2-3  times a day  If you do not have a history of heart disease, hypertension, diabetes or thyroid disease, prostate/bladder issues or glaucoma, you may also use Sudafed to treat nasal congestion.  It is highly recommended that you consult with a pharmacist or your primary care physician to ensure this medication is safe for you to take.     If you have a cough, you may use over-the-counter cough suppressants such as Delsym and Robitussin.  If you have glaucoma or high blood pressure, you can also use Coricidin HBP.   For cough I have prescribed for you A prescription cough medication called Promethazine -DM 6.25-15 mg/5 mL. Take 5 mL every 6 hours as needed for cough.  If you have a sore or scratchy throat, use a saltwater gargle-  to  teaspoon of salt dissolved in a 4-ounce to 8-ounce glass of warm water.  Gargle the solution for approximately 15-30 seconds and then spit.  It is important not to swallow the solution.  You can also use throat lozenges/cough drops and Chloraseptic spray to help with throat pain or discomfort.  Warm or cold liquids can also be helpful in relieving throat pain. I have also prescribed I have prescribed a Viscous Lidocaine  2% solution. Swallow 5-10 mL every 4-6 hours as needed for sore throat. DO NOT eat or drink anything for 15-20 minutes after swallowing to allow the medication to coat the throat.  For headache,  pain, or general discomfort, you can use Ibuprofen  or Tylenol  as directed.   Some authorities believe that zinc sprays or the use of Echinacea may shorten the course of your symptoms.   HOME CARE Only take medications as instructed by your medical team. Be sure to drink plenty of fluids. Water is fine as well as fruit juices, sodas and electrolyte beverages. You may want to stay away from caffeine or alcohol. If you are nauseated, try taking small sips of liquids. How do you know if you are getting enough fluid? Your urine should be a pale yellow or almost  colorless. Get rest. Taking a steamy shower or using a humidifier may help nasal congestion and ease sore throat pain. You can place a towel over your head and breathe in the steam from hot water coming from a faucet. Using a saline nasal spray works much the same way. Cough drops, hard candies and sore throat lozenges may ease your cough. Avoid close contacts especially the very young and the elderly Cover your mouth if you cough or sneeze Always remember to wash your hands.   GET HELP RIGHT AWAY IF: You develop worsening fever. If your symptoms do not improve within 10 days You develop yellow or green discharge from your nose over 3 days. You have coughing fits You develop a severe head ache or visual changes. You develop shortness of breath, difficulty breathing or start having chest pain Your symptoms persist after you have completed your treatment plan  MAKE SURE YOU  Understand these instructions. Will watch your condition. Will get help right away if you are not doing well or get worse.  Your e-visit answers were reviewed by a board certified advanced clinical practitioner to complete your personal care plan. Depending upon the condition, your plan could have included both over-the-counter or prescription medications.  Please review your pharmacy choice. If there is a problem, you may call our nursing hot line at and have the prescription routed to another pharmacy. Your safety is important to us . If you have drug allergies, check your prescription carefully.   You can use MyChart to ask questions about today's visit, request a non-urgent call back, or ask for a work or school excuse for 24 hours related to this e-Visit. If it has been greater than 24 hours you will need to follow up with your provider, or enter a new e-Visit to address those concerns. You will get an e-mail in the next two days asking about your experience.  I hope that your e-visit has been valuable and will  speed your recovery. Thank you for using e-visits.   I have spent 5 minutes in review of e-visit questionnaire, review and updating patient chart, medical decision making and response to patient.   Chiquita CHRISTELLA Barefoot, NP

## 2023-12-30 ENCOUNTER — Other Ambulatory Visit: Payer: Self-pay

## 2023-12-30 ENCOUNTER — Other Ambulatory Visit: Payer: Self-pay | Admitting: Nurse Practitioner

## 2023-12-30 DIAGNOSIS — I10 Essential (primary) hypertension: Secondary | ICD-10-CM

## 2023-12-30 MED ORDER — AMLODIPINE BESYLATE 5 MG PO TABS
5.0000 mg | ORAL_TABLET | Freq: Every day | ORAL | 3 refills | Status: AC
  Filled 2023-12-30: qty 90, 90d supply, fill #0

## 2024-02-14 ENCOUNTER — Ambulatory Visit: Admitting: Nurse Practitioner
